# Patient Record
Sex: Male | Born: 1955 | Race: White | Hispanic: No
Health system: Southern US, Community
[De-identification: ages and names within clinical notes are randomized; demographics above are authoritative.]

---

## 1978-05-29 ENCOUNTER — Encounter: Payer: Self-pay | Admitting: Internal Medicine

## 2002-06-01 ENCOUNTER — Ambulatory Visit (HOSPITAL_COMMUNITY): Admission: RE | Admit: 2002-06-01 | Discharge: 2002-06-02 | Payer: Self-pay | Admitting: Otolaryngology

## 2002-06-01 ENCOUNTER — Encounter (INDEPENDENT_AMBULATORY_CARE_PROVIDER_SITE_OTHER): Payer: Self-pay | Admitting: Specialist

## 2002-06-13 ENCOUNTER — Encounter: Payer: Self-pay | Admitting: Otolaryngology

## 2002-06-15 ENCOUNTER — Inpatient Hospital Stay (HOSPITAL_COMMUNITY): Admission: RE | Admit: 2002-06-15 | Discharge: 2002-06-20 | Payer: Self-pay | Admitting: Otolaryngology

## 2002-06-15 ENCOUNTER — Encounter (INDEPENDENT_AMBULATORY_CARE_PROVIDER_SITE_OTHER): Payer: Self-pay | Admitting: *Deleted

## 2002-07-04 ENCOUNTER — Ambulatory Visit: Admission: RE | Admit: 2002-07-04 | Discharge: 2002-09-28 | Payer: Self-pay | Admitting: *Deleted

## 2002-07-08 ENCOUNTER — Encounter: Admission: RE | Admit: 2002-07-08 | Discharge: 2002-07-08 | Payer: Self-pay | Admitting: Dentistry

## 2002-08-03 ENCOUNTER — Encounter: Payer: Self-pay | Admitting: *Deleted

## 2002-08-03 ENCOUNTER — Ambulatory Visit (HOSPITAL_COMMUNITY): Admission: RE | Admit: 2002-08-03 | Discharge: 2002-08-03 | Payer: Self-pay | Admitting: *Deleted

## 2002-08-29 ENCOUNTER — Ambulatory Visit: Admission: RE | Admit: 2002-08-29 | Discharge: 2002-08-29 | Payer: Self-pay | Admitting: Radiation Oncology

## 2002-10-04 ENCOUNTER — Encounter (HOSPITAL_COMMUNITY): Admission: RE | Admit: 2002-10-04 | Discharge: 2002-10-04 | Payer: Self-pay | Admitting: *Deleted

## 2002-10-18 ENCOUNTER — Ambulatory Visit: Admission: RE | Admit: 2002-10-18 | Discharge: 2002-10-18 | Payer: Self-pay | Admitting: *Deleted

## 2003-01-18 ENCOUNTER — Ambulatory Visit (HOSPITAL_COMMUNITY): Admission: RE | Admit: 2003-01-18 | Discharge: 2003-01-18 | Payer: Self-pay | Admitting: *Deleted

## 2003-05-22 ENCOUNTER — Ambulatory Visit (HOSPITAL_COMMUNITY): Admission: RE | Admit: 2003-05-22 | Discharge: 2003-05-22 | Payer: Self-pay | Admitting: Oncology

## 2003-05-24 ENCOUNTER — Ambulatory Visit: Admission: RE | Admit: 2003-05-24 | Discharge: 2003-05-24 | Payer: Self-pay | Admitting: *Deleted

## 2003-08-24 ENCOUNTER — Ambulatory Visit (HOSPITAL_COMMUNITY): Admission: RE | Admit: 2003-08-24 | Discharge: 2003-08-24 | Payer: Self-pay | Admitting: Oncology

## 2004-01-31 ENCOUNTER — Ambulatory Visit (HOSPITAL_COMMUNITY): Admission: RE | Admit: 2004-01-31 | Discharge: 2004-01-31 | Payer: Self-pay | Admitting: Oncology

## 2004-02-08 ENCOUNTER — Encounter: Payer: Self-pay | Admitting: Internal Medicine

## 2004-03-21 ENCOUNTER — Ambulatory Visit: Payer: Self-pay | Admitting: Internal Medicine

## 2004-05-02 ENCOUNTER — Ambulatory Visit: Payer: Self-pay | Admitting: Gastroenterology

## 2004-06-05 ENCOUNTER — Encounter: Payer: Self-pay | Admitting: Internal Medicine

## 2004-06-05 ENCOUNTER — Ambulatory Visit: Payer: Self-pay | Admitting: Gastroenterology

## 2004-06-11 ENCOUNTER — Ambulatory Visit: Payer: Self-pay | Admitting: Internal Medicine

## 2004-07-23 ENCOUNTER — Ambulatory Visit: Payer: Self-pay | Admitting: Oncology

## 2004-07-25 ENCOUNTER — Ambulatory Visit: Payer: Self-pay | Admitting: Internal Medicine

## 2004-07-25 ENCOUNTER — Ambulatory Visit (HOSPITAL_COMMUNITY): Admission: RE | Admit: 2004-07-25 | Discharge: 2004-07-25 | Payer: Self-pay | Admitting: Oncology

## 2004-08-12 ENCOUNTER — Ambulatory Visit: Payer: Self-pay | Admitting: Internal Medicine

## 2004-10-17 ENCOUNTER — Ambulatory Visit (HOSPITAL_COMMUNITY): Admission: RE | Admit: 2004-10-17 | Discharge: 2004-10-17 | Payer: Self-pay | Admitting: Oncology

## 2005-02-04 ENCOUNTER — Ambulatory Visit (HOSPITAL_COMMUNITY): Admission: RE | Admit: 2005-02-04 | Discharge: 2005-02-04 | Payer: Self-pay | Admitting: Oncology

## 2005-02-11 ENCOUNTER — Ambulatory Visit: Payer: Self-pay | Admitting: Oncology

## 2005-02-24 ENCOUNTER — Ambulatory Visit: Payer: Self-pay | Admitting: Internal Medicine

## 2005-04-03 ENCOUNTER — Ambulatory Visit: Payer: Self-pay | Admitting: Internal Medicine

## 2005-07-04 ENCOUNTER — Ambulatory Visit (HOSPITAL_COMMUNITY): Admission: RE | Admit: 2005-07-04 | Discharge: 2005-07-04 | Payer: Self-pay | Admitting: Oncology

## 2005-07-21 ENCOUNTER — Ambulatory Visit: Payer: Self-pay | Admitting: Oncology

## 2005-07-22 LAB — COMPREHENSIVE METABOLIC PANEL
ALT: 17 U/L (ref 0–40)
AST: 20 U/L (ref 0–37)
Albumin: 4.3 g/dL (ref 3.5–5.2)
Alkaline Phosphatase: 55 U/L (ref 39–117)
BUN: 8 mg/dL (ref 6–23)
CO2: 24 mEq/L (ref 19–32)
Calcium: 9.2 mg/dL (ref 8.4–10.5)
Chloride: 106 mEq/L (ref 96–112)
Creatinine, Ser: 1 mg/dL (ref 0.4–1.5)
Glucose, Bld: 98 mg/dL (ref 70–99)
Potassium: 3.8 mEq/L (ref 3.5–5.3)
Sodium: 141 mEq/L (ref 135–145)
Total Bilirubin: 0.6 mg/dL (ref 0.3–1.2)
Total Protein: 7.3 g/dL (ref 6.0–8.3)

## 2005-07-22 LAB — CBC WITH DIFFERENTIAL/PLATELET
BASO%: 0.7 % (ref 0.0–2.0)
EOS%: 2 % (ref 0.0–7.0)
HCT: 40.3 % (ref 38.7–49.9)
LYMPH%: 18.3 % (ref 14.0–48.0)
MCH: 31 pg (ref 28.0–33.4)
MCHC: 34.6 g/dL (ref 32.0–35.9)
MCV: 89.4 fL (ref 81.6–98.0)
MONO%: 11.3 % (ref 0.0–13.0)
NEUT%: 67.7 % (ref 40.0–75.0)
Platelets: 274 10*3/uL (ref 145–400)
RBC: 4.51 10*6/uL (ref 4.20–5.71)

## 2005-10-14 ENCOUNTER — Ambulatory Visit: Payer: Self-pay | Admitting: Internal Medicine

## 2005-10-14 LAB — CONVERTED CEMR LAB
RBC count: 4.71 10*6/uL
WBC, blood: 5.6 10*3/uL

## 2005-11-17 ENCOUNTER — Ambulatory Visit (HOSPITAL_COMMUNITY): Admission: RE | Admit: 2005-11-17 | Discharge: 2005-11-17 | Payer: Self-pay | Admitting: Oncology

## 2006-01-28 ENCOUNTER — Ambulatory Visit: Payer: Self-pay | Admitting: Oncology

## 2006-01-30 LAB — CBC WITH DIFFERENTIAL/PLATELET
BASO%: 0.9 % (ref 0.0–2.0)
EOS%: 1.6 % (ref 0.0–7.0)
HCT: 43.1 % (ref 38.7–49.9)
LYMPH%: 14.8 % (ref 14.0–48.0)
MCH: 31.2 pg (ref 28.0–33.4)
MCHC: 34.6 g/dL (ref 32.0–35.9)
MONO#: 0.8 10*3/uL (ref 0.1–0.9)
NEUT%: 73.9 % (ref 40.0–75.0)
Platelets: 262 10*3/uL (ref 145–400)
RBC: 4.79 10*6/uL (ref 4.20–5.71)
WBC: 9.2 10*3/uL (ref 4.0–10.0)
lymph#: 1.4 10*3/uL (ref 0.9–3.3)

## 2006-01-30 LAB — COMPREHENSIVE METABOLIC PANEL
ALT: 18 U/L (ref 0–40)
AST: 19 U/L (ref 0–37)
CO2: 24 mEq/L (ref 19–32)
Creatinine, Ser: 1.08 mg/dL (ref 0.40–1.50)
Sodium: 139 mEq/L (ref 135–145)
Total Bilirubin: 0.5 mg/dL (ref 0.3–1.2)
Total Protein: 7.4 g/dL (ref 6.0–8.3)

## 2006-02-17 ENCOUNTER — Ambulatory Visit: Payer: Self-pay | Admitting: Internal Medicine

## 2006-07-01 ENCOUNTER — Ambulatory Visit (HOSPITAL_COMMUNITY): Admission: RE | Admit: 2006-07-01 | Discharge: 2006-07-01 | Payer: Self-pay | Admitting: Oncology

## 2006-07-07 ENCOUNTER — Ambulatory Visit: Payer: Self-pay | Admitting: Oncology

## 2006-07-09 LAB — CBC WITH DIFFERENTIAL/PLATELET
BASO%: 0.9 % (ref 0.0–2.0)
EOS%: 2.4 % (ref 0.0–7.0)
MCH: 31.3 pg (ref 28.0–33.4)
MCHC: 35.4 g/dL (ref 32.0–35.9)
MONO#: 0.7 10*3/uL (ref 0.1–0.9)
RBC: 4.94 10*6/uL (ref 4.20–5.71)
RDW: 13.6 % (ref 11.2–14.6)
WBC: 7 10*3/uL (ref 4.0–10.0)
lymph#: 1.5 10*3/uL (ref 0.9–3.3)

## 2006-07-09 LAB — COMPREHENSIVE METABOLIC PANEL
ALT: 18 U/L (ref 0–53)
AST: 19 U/L (ref 0–37)
Albumin: 4.4 g/dL (ref 3.5–5.2)
Calcium: 9.9 mg/dL (ref 8.4–10.5)
Chloride: 103 mEq/L (ref 96–112)
Creatinine, Ser: 1.03 mg/dL (ref 0.40–1.50)
Potassium: 4.7 mEq/L (ref 3.5–5.3)
Sodium: 139 mEq/L (ref 135–145)
Total Protein: 7.6 g/dL (ref 6.0–8.3)

## 2006-08-12 ENCOUNTER — Ambulatory Visit: Payer: Self-pay | Admitting: Internal Medicine

## 2006-08-12 LAB — CONVERTED CEMR LAB
ALT: 18 units/L (ref 0–40)
Albumin: 4 g/dL (ref 3.5–5.2)
BUN: 6 mg/dL (ref 6–23)
Basophils Absolute: 0.1 10*3/uL (ref 0.0–0.1)
Basophils Relative: 1.5 % — ABNORMAL HIGH (ref 0.0–1.0)
Bilirubin, Direct: 0.1 mg/dL (ref 0.0–0.3)
CO2: 32 meq/L (ref 19–32)
Eosinophils Absolute: 0.2 10*3/uL (ref 0.0–0.6)
Glucose, Bld: 113 mg/dL — ABNORMAL HIGH (ref 70–99)
MCHC: 33.8 g/dL (ref 30.0–36.0)
Monocytes Absolute: 0.7 10*3/uL (ref 0.2–0.7)
Monocytes Relative: 10.7 % (ref 3.0–11.0)
Neutro Abs: 4 10*3/uL (ref 1.4–7.7)
Neutrophils Relative %: 61.2 % (ref 43.0–77.0)
RBC: 4.92 M/uL (ref 4.22–5.81)
TSH: 8.99 microintl units/mL — ABNORMAL HIGH (ref 0.35–5.50)
Total Bilirubin: 0.8 mg/dL (ref 0.3–1.2)
Total Protein: 7.3 g/dL (ref 6.0–8.3)
VLDL: 15 mg/dL (ref 0–40)

## 2006-08-19 ENCOUNTER — Encounter: Payer: Self-pay | Admitting: Internal Medicine

## 2006-08-19 DIAGNOSIS — Z860100 Personal history of colon polyps, unspecified: Secondary | ICD-10-CM | POA: Insufficient documentation

## 2006-08-19 DIAGNOSIS — Z86718 Personal history of other venous thrombosis and embolism: Secondary | ICD-10-CM

## 2006-08-19 DIAGNOSIS — C760 Malignant neoplasm of head, face and neck: Secondary | ICD-10-CM

## 2006-08-20 ENCOUNTER — Ambulatory Visit: Payer: Self-pay | Admitting: Internal Medicine

## 2006-11-26 DIAGNOSIS — M79609 Pain in unspecified limb: Secondary | ICD-10-CM

## 2006-11-27 ENCOUNTER — Ambulatory Visit: Payer: Self-pay | Admitting: Family Medicine

## 2006-11-27 ENCOUNTER — Ambulatory Visit: Payer: Self-pay

## 2006-11-27 ENCOUNTER — Encounter: Payer: Self-pay | Admitting: Internal Medicine

## 2007-01-15 ENCOUNTER — Ambulatory Visit: Payer: Self-pay | Admitting: Oncology

## 2007-01-19 LAB — COMPREHENSIVE METABOLIC PANEL
ALT: 20 U/L (ref 0–53)
AST: 19 U/L (ref 0–37)
Calcium: 9.4 mg/dL (ref 8.4–10.5)
Chloride: 104 mEq/L (ref 96–112)
Creatinine, Ser: 0.81 mg/dL (ref 0.40–1.50)
Total Bilirubin: 0.2 mg/dL — ABNORMAL LOW (ref 0.3–1.2)

## 2007-01-19 LAB — CBC WITH DIFFERENTIAL/PLATELET
BASO%: 0.6 % (ref 0.0–2.0)
EOS%: 2 % (ref 0.0–7.0)
HCT: 40.3 % (ref 38.7–49.9)
MCH: 31.6 pg (ref 28.0–33.4)
MCHC: 35.7 g/dL (ref 32.0–35.9)
NEUT%: 65.3 % (ref 40.0–75.0)
RBC: 4.55 10*6/uL (ref 4.20–5.71)
lymph#: 1.7 10*3/uL (ref 0.9–3.3)

## 2007-02-04 ENCOUNTER — Ambulatory Visit: Payer: Self-pay | Admitting: Internal Medicine

## 2007-07-19 ENCOUNTER — Encounter: Payer: Self-pay | Admitting: Internal Medicine

## 2007-07-20 ENCOUNTER — Ambulatory Visit: Payer: Self-pay | Admitting: Oncology

## 2007-07-21 ENCOUNTER — Telehealth: Payer: Self-pay | Admitting: Internal Medicine

## 2007-07-22 LAB — CBC WITH DIFFERENTIAL/PLATELET
BASO%: 0.5 % (ref 0.0–2.0)
EOS%: 2.3 % (ref 0.0–7.0)
HCT: 44.4 % (ref 38.7–49.9)
MCH: 30.5 pg (ref 28.0–33.4)
MCHC: 34.3 g/dL (ref 32.0–35.9)
MONO#: 1.1 10*3/uL — ABNORMAL HIGH (ref 0.1–0.9)
RBC: 4.99 10*6/uL (ref 4.20–5.71)
RDW: 14.6 % (ref 11.2–14.6)
WBC: 10.6 10*3/uL — ABNORMAL HIGH (ref 4.0–10.0)
lymph#: 1.6 10*3/uL (ref 0.9–3.3)

## 2007-07-22 LAB — COMPREHENSIVE METABOLIC PANEL
ALT: 18 U/L (ref 0–53)
AST: 18 U/L (ref 0–37)
CO2: 28 mEq/L (ref 19–32)
Calcium: 9.8 mg/dL (ref 8.4–10.5)
Chloride: 101 mEq/L (ref 96–112)
Potassium: 4.4 mEq/L (ref 3.5–5.3)
Sodium: 140 mEq/L (ref 135–145)
Total Protein: 7.7 g/dL (ref 6.0–8.3)

## 2007-07-27 ENCOUNTER — Telehealth: Payer: Self-pay | Admitting: Internal Medicine

## 2007-08-04 ENCOUNTER — Telehealth: Payer: Self-pay | Admitting: Internal Medicine

## 2007-08-09 ENCOUNTER — Ambulatory Visit: Payer: Self-pay | Admitting: Internal Medicine

## 2007-08-09 LAB — CONVERTED CEMR LAB
Albumin: 3.5 g/dL (ref 3.5–5.2)
Alkaline Phosphatase: 60 units/L (ref 39–117)
BUN: 10 mg/dL (ref 6–23)
Calcium: 9.2 mg/dL (ref 8.4–10.5)
Creatinine, Ser: 0.8 mg/dL (ref 0.4–1.5)
Eosinophils Absolute: 0.3 10*3/uL (ref 0.0–0.7)
Eosinophils Relative: 2.4 % (ref 0.0–5.0)
GFR calc Af Amer: 130 mL/min
GFR calc non Af Amer: 107 mL/min
Glucose, Bld: 105 mg/dL — ABNORMAL HIGH (ref 70–99)
Glucose, Urine, Semiquant: NEGATIVE
HCT: 45 % (ref 39.0–52.0)
HDL: 55.2 mg/dL (ref >39.0)
Hemoglobin: 15 g/dL (ref 13.0–17.0)
Ketones, urine, test strip: NEGATIVE
MCV: 92 fL (ref 78.0–100.0)
Monocytes Absolute: 0.7 10*3/uL (ref 0.1–1.0)
Monocytes Relative: 5.8 % (ref 3.0–12.0)
Neutro Abs: 8.9 10*3/uL — ABNORMAL HIGH (ref 1.4–7.7)
Nitrite: NEGATIVE
PSA: 0.74 ng/mL (ref 0.10–4.00)
Specific Gravity, Urine: 1.015
TSH: 15.86 microintl units/mL — ABNORMAL HIGH (ref 0.35–5.50)
Total CHOL/HDL Ratio: 3.3
Total Protein: 7.3 g/dL (ref 6.0–8.3)
Triglycerides: 60 mg/dL (ref 0–149)
WBC Urine, dipstick: NEGATIVE
WBC: 11.7 10*3/uL — ABNORMAL HIGH (ref 4.5–10.5)
pH: 7.5

## 2007-08-17 ENCOUNTER — Ambulatory Visit: Payer: Self-pay | Admitting: Internal Medicine

## 2007-08-17 DIAGNOSIS — C44300 Unspecified malignant neoplasm of skin of unspecified part of face: Secondary | ICD-10-CM | POA: Insufficient documentation

## 2007-08-17 DIAGNOSIS — IMO0002 Reserved for concepts with insufficient information to code with codable children: Secondary | ICD-10-CM

## 2007-08-17 DIAGNOSIS — E039 Hypothyroidism, unspecified: Secondary | ICD-10-CM | POA: Insufficient documentation

## 2007-08-17 DIAGNOSIS — C44309 Unspecified malignant neoplasm of skin of other parts of face: Secondary | ICD-10-CM | POA: Insufficient documentation

## 2007-08-25 ENCOUNTER — Telehealth (INDEPENDENT_AMBULATORY_CARE_PROVIDER_SITE_OTHER): Payer: Self-pay | Admitting: *Deleted

## 2007-09-20 ENCOUNTER — Ambulatory Visit: Payer: Self-pay | Admitting: Internal Medicine

## 2007-10-18 ENCOUNTER — Encounter: Payer: Self-pay | Admitting: Internal Medicine

## 2007-11-18 ENCOUNTER — Telehealth: Payer: Self-pay | Admitting: Internal Medicine

## 2007-12-09 ENCOUNTER — Ambulatory Visit: Payer: Self-pay | Admitting: Internal Medicine

## 2008-01-06 ENCOUNTER — Ambulatory Visit: Payer: Self-pay | Admitting: Internal Medicine

## 2008-01-06 DIAGNOSIS — J180 Bronchopneumonia, unspecified organism: Secondary | ICD-10-CM | POA: Insufficient documentation

## 2008-01-11 ENCOUNTER — Ambulatory Visit: Payer: Self-pay | Admitting: Internal Medicine

## 2008-01-17 ENCOUNTER — Ambulatory Visit: Payer: Self-pay | Admitting: Oncology

## 2008-01-19 LAB — COMPREHENSIVE METABOLIC PANEL
Albumin: 3.6 g/dL (ref 3.5–5.2)
Alkaline Phosphatase: 55 U/L (ref 39–117)
CO2: 29 mEq/L (ref 19–32)
Chloride: 102 mEq/L (ref 96–112)
Glucose, Bld: 97 mg/dL (ref 70–99)
Potassium: 4.1 mEq/L (ref 3.5–5.3)
Sodium: 137 mEq/L (ref 135–145)
Total Protein: 6.7 g/dL (ref 6.0–8.3)

## 2008-01-19 LAB — CBC WITH DIFFERENTIAL/PLATELET
Eosinophils Absolute: 0.2 10*3/uL (ref 0.0–0.5)
MONO#: 0.6 10*3/uL (ref 0.1–0.9)
MONO%: 8.9 % (ref 0.0–13.0)
NEUT#: 5 10*3/uL (ref 1.5–6.5)
RBC: 4.15 10*6/uL — ABNORMAL LOW (ref 4.20–5.71)
RDW: 14.7 % — ABNORMAL HIGH (ref 11.2–14.6)
WBC: 7.1 10*3/uL (ref 4.0–10.0)
lymph#: 1.2 10*3/uL (ref 0.9–3.3)

## 2008-02-17 ENCOUNTER — Ambulatory Visit: Payer: Self-pay | Admitting: Internal Medicine

## 2008-02-23 ENCOUNTER — Telehealth: Payer: Self-pay | Admitting: Internal Medicine

## 2008-02-24 ENCOUNTER — Encounter: Payer: Self-pay | Admitting: Internal Medicine

## 2008-02-25 ENCOUNTER — Ambulatory Visit: Payer: Self-pay | Admitting: Internal Medicine

## 2008-06-16 ENCOUNTER — Ambulatory Visit: Payer: Self-pay | Admitting: Internal Medicine

## 2008-06-16 DIAGNOSIS — J069 Acute upper respiratory infection, unspecified: Secondary | ICD-10-CM | POA: Insufficient documentation

## 2008-07-05 ENCOUNTER — Ambulatory Visit: Payer: Self-pay | Admitting: Internal Medicine

## 2008-07-05 DIAGNOSIS — K920 Hematemesis: Secondary | ICD-10-CM | POA: Insufficient documentation

## 2008-07-18 ENCOUNTER — Ambulatory Visit: Payer: Self-pay | Admitting: Internal Medicine

## 2008-07-18 LAB — CONVERTED CEMR LAB
AST: 17 units/L (ref 0–37)
Albumin: 3.8 g/dL (ref 3.5–5.2)
Alkaline Phosphatase: 58 units/L (ref 39–117)
Basophils Relative: 1.4 % (ref 0.0–3.0)
Chloride: 104 meq/L (ref 96–112)
Eosinophils Absolute: 0.9 10*3/uL — ABNORMAL HIGH (ref 0.0–0.7)
GFR calc non Af Amer: 106.84 mL/min (ref >60)
Glucose, Bld: 111 mg/dL — ABNORMAL HIGH (ref 70–99)
Glucose, Urine, Semiquant: NEGATIVE
Hemoglobin: 14.4 g/dL (ref 13.0–17.0)
MCHC: 35.3 g/dL (ref 30.0–36.0)
MCV: 88.5 fL (ref 78.0–100.0)
Monocytes Absolute: 1.2 10*3/uL — ABNORMAL HIGH (ref 0.1–1.0)
Neutro Abs: 11.9 10*3/uL — ABNORMAL HIGH (ref 1.4–7.7)
Potassium: 4.4 meq/L (ref 3.5–5.1)
Protein, U semiquant: NEGATIVE
RBC: 4.62 M/uL (ref 4.22–5.81)
Sodium: 141 meq/L (ref 135–145)
Total Bilirubin: 0.8 mg/dL (ref 0.3–1.2)
VLDL: 10.6 mg/dL (ref 0.0–40.0)
WBC Urine, dipstick: NEGATIVE
pH: 7

## 2008-07-26 ENCOUNTER — Ambulatory Visit: Payer: Self-pay | Admitting: Internal Medicine

## 2008-08-25 ENCOUNTER — Ambulatory Visit: Payer: Self-pay | Admitting: Internal Medicine

## 2008-09-14 ENCOUNTER — Ambulatory Visit: Payer: Self-pay | Admitting: Internal Medicine

## 2008-09-14 DIAGNOSIS — M549 Dorsalgia, unspecified: Secondary | ICD-10-CM | POA: Insufficient documentation

## 2008-10-02 ENCOUNTER — Ambulatory Visit: Payer: Self-pay | Admitting: Internal Medicine

## 2008-10-26 ENCOUNTER — Ambulatory Visit: Payer: Self-pay | Admitting: Internal Medicine

## 2008-11-22 ENCOUNTER — Ambulatory Visit: Payer: Self-pay | Admitting: Oncology

## 2008-11-24 LAB — CBC WITH DIFFERENTIAL/PLATELET
Basophils Absolute: 0 10*3/uL (ref 0.0–0.1)
Eosinophils Absolute: 0.4 10*3/uL (ref 0.0–0.5)
HCT: 35.9 % — ABNORMAL LOW (ref 38.4–49.9)
HGB: 12.4 g/dL — ABNORMAL LOW (ref 13.0–17.1)
LYMPH%: 17.6 % (ref 14.0–49.0)
MCV: 88.2 fL (ref 79.3–98.0)
MONO#: 0.8 10*3/uL (ref 0.1–0.9)
MONO%: 8.6 % (ref 0.0–14.0)
NEUT#: 6.2 10*3/uL (ref 1.5–6.5)
NEUT%: 69 % (ref 39.0–75.0)
Platelets: 238 10*3/uL (ref 140–400)
RBC: 4.07 10*6/uL — ABNORMAL LOW (ref 4.20–5.82)
WBC: 9 10*3/uL (ref 4.0–10.3)

## 2008-11-24 LAB — COMPREHENSIVE METABOLIC PANEL
Alkaline Phosphatase: 51 U/L (ref 39–117)
BUN: 38 mg/dL — ABNORMAL HIGH (ref 6–23)
CO2: 23 mEq/L (ref 19–32)
Glucose, Bld: 104 mg/dL — ABNORMAL HIGH (ref 70–99)
Sodium: 130 mEq/L — ABNORMAL LOW (ref 135–145)
Total Bilirubin: 0.4 mg/dL (ref 0.3–1.2)
Total Protein: 7.6 g/dL (ref 6.0–8.3)

## 2008-11-29 ENCOUNTER — Ambulatory Visit (HOSPITAL_COMMUNITY): Admission: RE | Admit: 2008-11-29 | Discharge: 2008-11-29 | Payer: Self-pay | Admitting: Oncology

## 2008-11-29 ENCOUNTER — Encounter: Payer: Self-pay | Admitting: Internal Medicine

## 2008-12-14 LAB — CBC WITH DIFFERENTIAL/PLATELET
Basophils Absolute: 0.1 10*3/uL (ref 0.0–0.1)
Eosinophils Absolute: 0.4 10*3/uL (ref 0.0–0.5)
HGB: 11.5 g/dL — ABNORMAL LOW (ref 13.0–17.1)
LYMPH%: 22.3 % (ref 14.0–49.0)
MONO#: 0.8 10*3/uL (ref 0.1–0.9)
NEUT#: 4.5 10*3/uL (ref 1.5–6.5)
Platelets: 311 10*3/uL (ref 140–400)
RBC: 3.79 10*6/uL — ABNORMAL LOW (ref 4.20–5.82)
RDW: 13.9 % (ref 11.0–14.6)
WBC: 7.5 10*3/uL (ref 4.0–10.3)

## 2008-12-14 LAB — COMPREHENSIVE METABOLIC PANEL
Albumin: 3.9 g/dL (ref 3.5–5.2)
BUN: 19 mg/dL (ref 6–23)
CO2: 23 mEq/L (ref 19–32)
Calcium: 9.1 mg/dL (ref 8.4–10.5)
Glucose, Bld: 112 mg/dL — ABNORMAL HIGH (ref 70–99)
Potassium: 4.8 mEq/L (ref 3.5–5.3)
Sodium: 133 mEq/L — ABNORMAL LOW (ref 135–145)
Total Protein: 7.4 g/dL (ref 6.0–8.3)

## 2008-12-19 ENCOUNTER — Ambulatory Visit: Payer: Self-pay | Admitting: Internal Medicine

## 2008-12-28 ENCOUNTER — Ambulatory Visit: Payer: Self-pay | Admitting: Internal Medicine

## 2008-12-28 DIAGNOSIS — S8000XA Contusion of unspecified knee, initial encounter: Secondary | ICD-10-CM

## 2009-01-08 ENCOUNTER — Encounter: Payer: Self-pay | Admitting: Internal Medicine

## 2009-01-18 ENCOUNTER — Encounter (INDEPENDENT_AMBULATORY_CARE_PROVIDER_SITE_OTHER): Payer: Self-pay | Admitting: *Deleted

## 2009-02-07 ENCOUNTER — Ambulatory Visit: Payer: Self-pay | Admitting: Internal Medicine

## 2009-02-08 ENCOUNTER — Ambulatory Visit: Payer: Self-pay | Admitting: Internal Medicine

## 2009-02-08 DIAGNOSIS — Z87891 Personal history of nicotine dependence: Secondary | ICD-10-CM

## 2009-02-15 ENCOUNTER — Ambulatory Visit: Payer: Self-pay | Admitting: Internal Medicine

## 2009-02-15 DIAGNOSIS — L255 Unspecified contact dermatitis due to plants, except food: Secondary | ICD-10-CM | POA: Insufficient documentation

## 2009-03-19 ENCOUNTER — Ambulatory Visit: Payer: Self-pay | Admitting: Internal Medicine

## 2009-03-27 ENCOUNTER — Ambulatory Visit: Payer: Self-pay | Admitting: Internal Medicine

## 2009-05-02 ENCOUNTER — Encounter (INDEPENDENT_AMBULATORY_CARE_PROVIDER_SITE_OTHER): Payer: Self-pay | Admitting: *Deleted

## 2009-05-07 ENCOUNTER — Ambulatory Visit: Payer: Self-pay | Admitting: Internal Medicine

## 2009-06-04 ENCOUNTER — Ambulatory Visit: Payer: Self-pay | Admitting: Internal Medicine

## 2009-06-11 ENCOUNTER — Ambulatory Visit: Payer: Self-pay | Admitting: Oncology

## 2009-06-13 ENCOUNTER — Encounter: Payer: Self-pay | Admitting: Internal Medicine

## 2009-06-13 LAB — CBC WITH DIFFERENTIAL/PLATELET
Basophils Absolute: 0.1 10*3/uL (ref 0.0–0.1)
EOS%: 4.7 % (ref 0.0–7.0)
Eosinophils Absolute: 0.5 10*3/uL (ref 0.0–0.5)
HCT: 35.1 % — ABNORMAL LOW (ref 38.4–49.9)
HGB: 11.8 g/dL — ABNORMAL LOW (ref 13.0–17.1)
MCH: 30.2 pg (ref 27.2–33.4)
MCV: 90.1 fL (ref 79.3–98.0)
MONO%: 8.7 % (ref 0.0–14.0)
NEUT#: 7.4 10*3/uL — ABNORMAL HIGH (ref 1.5–6.5)
NEUT%: 68.1 % (ref 39.0–75.0)
Platelets: 300 10*3/uL (ref 140–400)

## 2009-06-13 LAB — COMPREHENSIVE METABOLIC PANEL
AST: 19 U/L (ref 0–37)
Albumin: 3.5 g/dL (ref 3.5–5.2)
Alkaline Phosphatase: 56 U/L (ref 39–117)
BUN: 18 mg/dL (ref 6–23)
Calcium: 9.1 mg/dL (ref 8.4–10.5)
Creatinine, Ser: 0.95 mg/dL (ref 0.40–1.50)
Glucose, Bld: 90 mg/dL (ref 70–99)

## 2009-06-15 LAB — IRON AND TIBC
%SAT: 12 % — ABNORMAL LOW (ref 20–55)
Iron: 36 ug/dL — ABNORMAL LOW (ref 42–165)
TIBC: 290 ug/dL (ref 215–435)
UIBC: 254 ug/dL

## 2009-06-15 LAB — SPEP & IFE WITH QIG
Albumin ELP: 46.9 % — ABNORMAL LOW (ref 55.8–66.1)
Alpha-1-Globulin: 6 % — ABNORMAL HIGH (ref 2.9–4.9)
Alpha-2-Globulin: 11.3 % (ref 7.1–11.8)
Beta 2: 7.1 % — ABNORMAL HIGH (ref 3.2–6.5)
Beta Globulin: 6.1 % (ref 4.7–7.2)
Gamma Globulin: 22.6 % — ABNORMAL HIGH (ref 11.1–18.8)
IgA: 448 mg/dL — ABNORMAL HIGH (ref 68–378)
IgG (Immunoglobin G), Serum: 1690 mg/dL — ABNORMAL HIGH (ref 694–1618)
IgM, Serum: 54 mg/dL — ABNORMAL LOW (ref 60–263)
Total Protein, Serum Electrophoresis: 7.9 g/dL (ref 6.0–8.3)

## 2009-06-15 LAB — FERRITIN: Ferritin: 151 ng/mL (ref 22–322)

## 2009-06-15 LAB — FOLATE: Folate: >20 ng/mL

## 2009-06-15 LAB — ERYTHROPOIETIN: Erythropoietin: 27.5 m[IU]/mL (ref 2.6–34.0)

## 2009-06-15 LAB — VITAMIN B12: Vitamin B-12: 1054 pg/mL — ABNORMAL HIGH (ref 211–911)

## 2009-06-28 ENCOUNTER — Encounter (INDEPENDENT_AMBULATORY_CARE_PROVIDER_SITE_OTHER): Payer: Self-pay | Admitting: *Deleted

## 2009-06-29 ENCOUNTER — Ambulatory Visit: Payer: Self-pay | Admitting: Gastroenterology

## 2009-07-13 ENCOUNTER — Ambulatory Visit: Payer: Self-pay | Admitting: Gastroenterology

## 2009-08-16 ENCOUNTER — Ambulatory Visit: Payer: Self-pay | Admitting: Internal Medicine

## 2009-08-16 LAB — CONVERTED CEMR LAB
Albumin: 3.5 g/dL (ref 3.5–5.2)
BUN: 10 mg/dL (ref 6–23)
Basophils Absolute: 0.2 10*3/uL — ABNORMAL HIGH (ref 0.0–0.1)
Bilirubin Urine: NEGATIVE
CO2: 30 meq/L (ref 19–32)
Calcium: 9.1 mg/dL (ref 8.4–10.5)
Cholesterol: 175 mg/dL (ref 0–200)
Eosinophils Absolute: 0.9 10*3/uL — ABNORMAL HIGH (ref 0.0–0.7)
GFR calc non Af Amer: 92.89 mL/min (ref >60)
Glucose, Bld: 109 mg/dL — ABNORMAL HIGH (ref 70–99)
HCT: 40.8 % (ref 39.0–52.0)
HDL: 44.8 mg/dL (ref >39.00)
Hemoglobin: 13.6 g/dL (ref 13.0–17.0)
Ketones, urine, test strip: NEGATIVE
Lymphocytes Relative: 18.4 % (ref 12.0–46.0)
Lymphs Abs: 2.6 10*3/uL (ref 0.7–4.0)
MCHC: 33.4 g/dL (ref 30.0–36.0)
Neutro Abs: 9.5 10*3/uL — ABNORMAL HIGH (ref 1.4–7.7)
Platelets: 278 10*3/uL (ref 150.0–400.0)
Protein, U semiquant: NEGATIVE
RDW: 15.4 % — ABNORMAL HIGH (ref 11.5–14.6)
Sodium: 140 meq/L (ref 135–145)
Specific Gravity, Urine: 1.015
TSH: 3.21 microintl units/mL (ref 0.35–5.50)
Total Bilirubin: 0.4 mg/dL (ref 0.3–1.2)
Triglycerides: 62 mg/dL (ref 0.0–149.0)
VLDL: 12.4 mg/dL (ref 0.0–40.0)
WBC Urine, dipstick: NEGATIVE
pH: 7

## 2009-08-24 ENCOUNTER — Ambulatory Visit: Payer: Self-pay | Admitting: Internal Medicine

## 2009-09-18 ENCOUNTER — Telehealth: Payer: Self-pay

## 2009-09-20 ENCOUNTER — Ambulatory Visit: Payer: Self-pay | Admitting: Internal Medicine

## 2009-10-30 ENCOUNTER — Telehealth: Payer: Self-pay | Admitting: Internal Medicine

## 2009-11-01 ENCOUNTER — Encounter: Payer: Self-pay | Admitting: Internal Medicine

## 2009-11-01 ENCOUNTER — Ambulatory Visit (HOSPITAL_COMMUNITY): Admission: RE | Admit: 2009-11-01 | Discharge: 2009-11-01 | Payer: Self-pay | Admitting: Oncology

## 2009-11-08 ENCOUNTER — Ambulatory Visit: Payer: Self-pay | Admitting: Oncology

## 2009-11-12 ENCOUNTER — Encounter: Payer: Self-pay | Admitting: Internal Medicine

## 2009-11-12 LAB — CBC WITH DIFFERENTIAL/PLATELET
Basophils Absolute: 0.1 10*3/uL (ref 0.0–0.1)
Eosinophils Absolute: 0.4 10*3/uL (ref 0.0–0.5)
HCT: 41.2 % (ref 38.4–49.9)
HGB: 13.8 g/dL (ref 13.0–17.1)
MCH: 29.1 pg (ref 27.2–33.4)
MONO#: 1.2 10*3/uL — ABNORMAL HIGH (ref 0.1–0.9)
NEUT#: 8.1 10*3/uL — ABNORMAL HIGH (ref 1.5–6.5)
NEUT%: 71 % (ref 39.0–75.0)
lymph#: 1.6 10*3/uL (ref 0.9–3.3)

## 2009-11-19 ENCOUNTER — Ambulatory Visit: Payer: Self-pay | Admitting: Internal Medicine

## 2009-12-06 ENCOUNTER — Encounter: Payer: Self-pay | Admitting: Gastroenterology

## 2009-12-06 ENCOUNTER — Ambulatory Visit: Payer: Self-pay | Admitting: Internal Medicine

## 2009-12-06 ENCOUNTER — Telehealth: Payer: Self-pay

## 2009-12-06 DIAGNOSIS — R109 Unspecified abdominal pain: Secondary | ICD-10-CM | POA: Insufficient documentation

## 2009-12-06 DIAGNOSIS — R634 Abnormal weight loss: Secondary | ICD-10-CM

## 2010-02-04 ENCOUNTER — Ambulatory Visit: Payer: Self-pay | Admitting: Gastroenterology

## 2010-02-04 DIAGNOSIS — R112 Nausea with vomiting, unspecified: Secondary | ICD-10-CM | POA: Insufficient documentation

## 2010-02-05 LAB — CONVERTED CEMR LAB
ALT: 19 units/L (ref 0–53)
AST: 18 units/L (ref 0–37)
Albumin: 2.9 g/dL — ABNORMAL LOW (ref 3.5–5.2)
Alkaline Phosphatase: 63 units/L (ref 39–117)
Basophils Relative: 0.9 % (ref 0.0–3.0)
Chloride: 100 meq/L (ref 96–112)
Eosinophils Relative: 5.7 % — ABNORMAL HIGH (ref 0.0–5.0)
GFR calc non Af Amer: 79.36 mL/min (ref >60)
HCT: 34.3 % — ABNORMAL LOW (ref 39.0–52.0)
Hemoglobin: 11.5 g/dL — ABNORMAL LOW (ref 13.0–17.0)
Lymphs Abs: 2.2 10*3/uL (ref 0.7–4.0)
MCV: 84.4 fL (ref 78.0–100.0)
Monocytes Relative: 9 % (ref 3.0–12.0)
Neutro Abs: 7.4 10*3/uL (ref 1.4–7.7)
Potassium: 5 meq/L (ref 3.5–5.1)
RBC: 4.07 M/uL — ABNORMAL LOW (ref 4.22–5.81)
Sodium: 139 meq/L (ref 135–145)
TSH: 5.73 microintl units/mL — ABNORMAL HIGH (ref 0.35–5.50)
Total Protein: 7.8 g/dL (ref 6.0–8.3)
WBC: 11.4 10*3/uL — ABNORMAL HIGH (ref 4.5–10.5)

## 2010-02-06 ENCOUNTER — Ambulatory Visit: Payer: Self-pay | Admitting: Gastroenterology

## 2010-02-06 ENCOUNTER — Other Ambulatory Visit
Admission: RE | Admit: 2010-02-06 | Discharge: 2010-02-06 | Payer: Self-pay | Source: Home / Self Care | Admitting: Gastroenterology

## 2010-02-06 LAB — CONVERTED CEMR LAB: Folate: >20 ng/mL

## 2010-02-07 LAB — CONVERTED CEMR LAB
Iron: 30 ug/dL — ABNORMAL LOW (ref 42–165)
Saturation Ratios: 9 % — ABNORMAL LOW (ref 20.0–50.0)
Transferrin: 237.1 mg/dL (ref 212.0–360.0)

## 2010-02-10 ENCOUNTER — Encounter: Payer: Self-pay | Admitting: Gastroenterology

## 2010-02-14 ENCOUNTER — Telehealth: Payer: Self-pay | Admitting: Gastroenterology

## 2010-02-15 ENCOUNTER — Ambulatory Visit: Payer: Self-pay | Admitting: Internal Medicine

## 2010-02-18 ENCOUNTER — Ambulatory Visit: Payer: Self-pay | Admitting: Cardiology

## 2010-02-21 ENCOUNTER — Ambulatory Visit: Payer: Self-pay | Admitting: Internal Medicine

## 2010-02-22 ENCOUNTER — Encounter: Payer: Self-pay | Admitting: Internal Medicine

## 2010-02-22 ENCOUNTER — Ambulatory Visit: Payer: Self-pay | Admitting: Oncology

## 2010-02-22 LAB — CBC WITH DIFFERENTIAL/PLATELET
Eosinophils Absolute: 0.2 10*3/uL (ref 0.0–0.5)
HCT: 37.9 % — ABNORMAL LOW (ref 38.4–49.9)
LYMPH%: 10.1 % — ABNORMAL LOW (ref 14.0–49.0)
MONO#: 1 10*3/uL — ABNORMAL HIGH (ref 0.1–0.9)
NEUT#: 13.2 10*3/uL — ABNORMAL HIGH (ref 1.5–6.5)
NEUT%: 81.6 % — ABNORMAL HIGH (ref 39.0–75.0)
Platelets: 278 10*3/uL (ref 140–400)
WBC: 16.1 10*3/uL — ABNORMAL HIGH (ref 4.0–10.3)

## 2010-02-22 LAB — COMPREHENSIVE METABOLIC PANEL
CO2: 27 mEq/L (ref 19–32)
Calcium: 8.9 mg/dL (ref 8.4–10.5)
Chloride: 99 mEq/L (ref 96–112)
Creatinine, Ser: 0.92 mg/dL (ref 0.40–1.50)
Glucose, Bld: 142 mg/dL — ABNORMAL HIGH (ref 70–99)
Sodium: 136 mEq/L (ref 135–145)
Total Bilirubin: 0.3 mg/dL (ref 0.3–1.2)
Total Protein: 7.9 g/dL (ref 6.0–8.3)

## 2010-02-28 ENCOUNTER — Encounter: Payer: Self-pay | Admitting: Internal Medicine

## 2010-02-28 ENCOUNTER — Ambulatory Visit: Payer: Self-pay | Admitting: Cardiothoracic Surgery

## 2010-02-28 ENCOUNTER — Ambulatory Visit (HOSPITAL_COMMUNITY)
Admission: RE | Admit: 2010-02-28 | Discharge: 2010-02-28 | Payer: Self-pay | Source: Home / Self Care | Admitting: Oncology

## 2010-03-12 ENCOUNTER — Telehealth: Payer: Self-pay | Admitting: Internal Medicine

## 2010-03-18 ENCOUNTER — Ambulatory Visit: Payer: Self-pay | Admitting: Internal Medicine

## 2010-03-18 DIAGNOSIS — J8289 Other pulmonary eosinophilia, not elsewhere classified: Secondary | ICD-10-CM | POA: Insufficient documentation

## 2010-03-18 DIAGNOSIS — J82 Pulmonary eosinophilia, not elsewhere classified: Secondary | ICD-10-CM

## 2010-03-19 ENCOUNTER — Telehealth (INDEPENDENT_AMBULATORY_CARE_PROVIDER_SITE_OTHER): Payer: Self-pay | Admitting: *Deleted

## 2010-03-19 LAB — CONVERTED CEMR LAB
Eosinophils Absolute: 0.3 10*3/uL (ref 0.0–0.7)
Eosinophils Relative: 2.3 % (ref 0.0–5.0)
HIV: NONREACTIVE
MCHC: 33 g/dL (ref 30.0–36.0)
MCV: 86.1 fL (ref 78.0–100.0)
Monocytes Absolute: 1 10*3/uL (ref 0.1–1.0)
Neutrophils Relative %: 74.8 % (ref 43.0–77.0)
Platelets: 302 10*3/uL (ref 150.0–400.0)
Sed Rate: 91 mm/hr — ABNORMAL HIGH (ref 0–22)
WBC: 14 10*3/uL — ABNORMAL HIGH (ref 4.5–10.5)

## 2010-03-20 ENCOUNTER — Encounter: Payer: Self-pay | Admitting: Internal Medicine

## 2010-03-29 ENCOUNTER — Encounter: Payer: Self-pay | Admitting: Internal Medicine

## 2010-03-29 ENCOUNTER — Ambulatory Visit (HOSPITAL_COMMUNITY)
Admission: RE | Admit: 2010-03-29 | Discharge: 2010-03-29 | Payer: Self-pay | Source: Home / Self Care | Attending: Internal Medicine | Admitting: Internal Medicine

## 2010-04-02 ENCOUNTER — Encounter: Payer: Self-pay | Admitting: Internal Medicine

## 2010-04-03 ENCOUNTER — Ambulatory Visit: Payer: Self-pay | Admitting: Internal Medicine

## 2010-04-09 ENCOUNTER — Telehealth: Payer: Self-pay | Admitting: Internal Medicine

## 2010-04-11 ENCOUNTER — Ambulatory Visit: Payer: Self-pay | Admitting: Gastroenterology

## 2010-04-11 DIAGNOSIS — R1312 Dysphagia, oropharyngeal phase: Secondary | ICD-10-CM

## 2010-04-11 DIAGNOSIS — D509 Iron deficiency anemia, unspecified: Secondary | ICD-10-CM | POA: Insufficient documentation

## 2010-05-12 ENCOUNTER — Encounter: Payer: Self-pay | Admitting: Oncology

## 2010-05-13 ENCOUNTER — Emergency Department (HOSPITAL_COMMUNITY)
Admission: EM | Admit: 2010-05-13 | Discharge: 2010-05-22 | Disposition: E | Payer: Self-pay | Source: Home / Self Care | Admitting: Emergency Medicine

## 2010-05-13 ENCOUNTER — Encounter: Payer: Self-pay | Admitting: Internal Medicine

## 2010-05-13 ENCOUNTER — Ambulatory Visit: Payer: Self-pay | Admitting: Oncology

## 2010-05-15 ENCOUNTER — Telehealth: Payer: Self-pay | Admitting: Internal Medicine

## 2010-05-16 ENCOUNTER — Ambulatory Visit: Admit: 2010-05-16 | Payer: Self-pay | Admitting: Internal Medicine

## 2010-05-17 ENCOUNTER — Ambulatory Visit: Admit: 2010-05-17 | Payer: Self-pay | Admitting: Internal Medicine

## 2010-05-17 ENCOUNTER — Telehealth: Payer: Self-pay | Admitting: Internal Medicine

## 2010-05-21 NOTE — Assessment & Plan Note (Signed)
Summary: consult re: abdominal pains/cjr   Vital Signs:  Patient profile:   55 year old male Weight:      183 pounds Temp:     97.9 degrees F oral BP sitting:   124 / 82  (left arm) Cuff size:   regular  Vitals Entered By: Kathrynn Speed CMA (December 06, 2009 8:12 AM) CC: Consult abdominal pain for years, pain is worse, nausea monday, src   CC:  Consult abdominal pain for years, pain is worse, nausea monday, and src.  History of Present Illness: 53 -year-old patient who has a prior history of head and neck neoplasm.  He has had a fairly recent PET scan that has been negative.  For the past two years, he has had intermittent  left upper quadrant pain.  This is associated with episodes of nausea and vomiting that  last 12-24 hrs.  Over the past year.  He has lost approximately 25 pounds in weight.  He has a history of colonic polyps and did have a colonoscopy earlier this year.  Medical regimen includes diclofenac for knee pain.  He states that his appetite is well maintained.  Denies any significant heartburn symptoms or swallowing difficulty  Current Medications (verified): 1)  Valium 10 Mg Tabs (Diazepam) .... Take 1 Tablet By Mouth Every Six Hours 2)  Acyclovir 200 Mg  Caps (Acyclovir) .Marland Kitchen.. 1 Qid As Needed 3)  Viagra 100 Mg  Tabs (Sildenafil Citrate) .... As Directed 4)  Bufferin 325 Mg  Tabs (Aspirin Buf(Cacarb-Mgcarb-Mgo)) .Marland Kitchen.. 1 Once Daily 5)  Levothroid 100 Mcg  Tabs (Levothyroxine Sodium) .... One Daily 6)  Alavert Allergy/sinus 5-120 Mg  Tb12 (Loratadine-Pseudoephedrine) .... One Twice Daily As Needed 7)  Diclofenac Sodium 75 Mg Tbec (Diclofenac Sodium) .... One Twice Daily As Needed For Knee Pain 8)  Tramadol Hcl 50 Mg Tabs (Tramadol Hcl) .... Q8hrs Prn 9)  Mucinex 600 Mg Xr12h-Tab (Guaifenesin) .... Bid 10)  Bystolic 5 Mg Tabs (Nebivolol Hcl) .... One Daily 11)  Prochlorperazine Edisylate 5 Mg/ml Soln (Prochlorperazine Edisylate)  Allergies (verified): No Known Drug  Allergies  Past History:  Past Medical History: Reviewed history from 08/17/2007 and no changes required. 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 colonoscopy February 2006, 06-2009 negative  neck CT March 2008 status post left radical neck dissection and tonsillectomy status post radiation treatment with cis-platinum as a radiosensitizer left knee arthroscopic surgery February 1980 (, torn medial and lateral menisci)  Review of Systems       The patient complains of weight loss and abdominal pain.  The patient denies anorexia, fever, weight gain, vision loss, decreased hearing, hoarseness, chest pain, syncope, dyspnea on exertion, peripheral edema, prolonged cough, headaches, hemoptysis, melena, hematochezia, severe indigestion/heartburn, hematuria, incontinence, genital sores, muscle weakness, suspicious skin lesions, transient blindness, difficulty walking, depression, unusual weight change, abnormal bleeding, enlarged lymph nodes, angioedema, breast masses, and testicular masses.    Physical Exam  General:  underweight appearing.  140/84underweight appearing.   Head:  Normocephalic and atraumatic without obvious abnormalities. No apparent alopecia or balding. Mouth:  Oral mucosa and oropharynx without lesions or exudates.  Neck:  status post neck dissection Lungs:  scattered rhonchi bilaterally Heart:  Normal rate and regular rhythm. S1 and S2 normal without gallop, murmur, click, rub or other extra sounds. Abdomen:  Bowel sounds  positive,abdomen soft and non-tender without masses, organomegaly or hernias noted. Pulses:  R and L carotid,radial,femoral,dorsalis pedis and posterior tibial pulses are full and equal bilaterally Extremities:  No clubbing, cyanosis, edema,  or deformity noted with normal full range of motion of all joints.     Impression & Recommendations:  Problem # 1:  ABDOMINAL PAIN (ICD-789.00)  Will refer to GI for further evaluation and consideration of upper endoscopy; discontinue all anti-inflammatory medication  Orders: Gastroenterology Referral (GI)  Problem # 2:  WEIGHT LOSS, ABNORMAL (ICD-783.21)  Orders: Gastroenterology Referral (GI)  Problem # 3:  HYPERTENSION NEC (ICD-997.91)  Complete Medication List: 1)  Valium 10 Mg Tabs (Diazepam) .... Take 1 tablet by mouth every six hours 2)  Acyclovir 200 Mg Caps (Acyclovir) .Marland Kitchen.. 1 qid as needed 3)  Viagra 100 Mg Tabs (Sildenafil citrate) .... As directed 4)  Bufferin 325 Mg Tabs (Aspirin buf(cacarb-mgcarb-mgo)) .Marland Kitchen.. 1 once daily 5)  Levothroid 100 Mcg Tabs (Levothyroxine sodium) .... One daily 6)  Alavert Allergy/sinus 5-120 Mg Tb12 (Loratadine-pseudoephedrine) .... One twice daily as needed 7)  Diclofenac Sodium 75 Mg Tbec (Diclofenac sodium) .... One twice daily as needed for knee pain 8)  Tramadol Hcl 50 Mg Tabs (Tramadol hcl) .... Q8hrs prn 9)  Mucinex 600 Mg Xr12h-tab (Guaifenesin) .... Bid 10)  Bystolic 5 Mg Tabs (Nebivolol hcl) .... One daily 11)  Prochlorperazine Edisylate 5 Mg/ml Soln (Prochlorperazine edisylate)  Patient Instructions: 1)  discontinue diclofenac, and all anti-inflammatory medication 2)  GI follow-up as scheduled 3)  Check your Blood Pressure regularly. If it is above: 160/90 you should make an appointment.

## 2010-05-21 NOTE — Progress Notes (Signed)
Summary: pt returned call > lab results given  Phone Note Call from Patient   Caller: Patient Call For: wert Summary of Call: pt returned call from nurse. call [phone] Initial call taken by: Tivis Ringer, CNA,  March 19, 2010 9:12 AM  Follow-up for Phone Call        pt called again for lab results.  advised of lab results.  pt verbalized his understanding.  will keep his 12.14.11 appt w/ MW. Follow-up by: Boone Master CNA/MA,  March 19, 2010 9:38 AM

## 2010-05-21 NOTE — Assessment & Plan Note (Signed)
Summary: ABD PAIN & WEIGHT LOSS/YF   History of Present Illness Visit Type: follow up Primary GI MD: Elie Goody MD [facility] Primary Provider: Gordy Savers, MD  Requesting Provider: Gordy Savers, MD  Chief Complaint: Generalized abd pain, loss of appetite, weight loss, bloating, belching, nausea and vomiting, constipation and diarrhea  History of Present Illness:   This is a 55 year old male with a history of metastatic tonsillar squamous cell cancer initially diagnosed in 2004. He relates a 9 month history of intermittent nausea, vomiting, anorexia, and a 40 pound weight loss. He has also had alternating constipation, diarrhea. He had a few weeks of left-sided abdominal pain, which radiated to his flank and left back which resolved spontaneously. He underwent a PET/CT scan of his skull, neck, chest, and abdomen in July 2011, which showed some abnormal pulmonary findings that did not appear typical for metastatic disease. No gastrointestinal abnormalities were noted.  He underwent colonoscopy in March 2011 for followup of adenomatous colon polyps, revealing diverticulosis, a small colon polyp, and a fair bowel preparation. Blood work performed in April was unremarkable.  In addition, he notes intermittent difficulties with choking when swallowing foods, both solids and liquids for several years, and the symptoms appear to worsen over the past several months.   GI Review of Systems    Reports abdominal pain, belching, bloating, dysphagia with liquids, dysphagia with solids, loss of appetite, nausea, vomiting, and  weight loss.     Location of  Abdominal pain: generalized. Weight loss of 40 pounds over 9 months .   Denies acid reflux, chest pain, heartburn, vomiting blood, and  weight gain.      Reports change in bowel habits, constipation, diarrhea, and  hemorrhoids.     Denies anal fissure, black tarry stools, diverticulosis, fecal incontinence, heme positive stool, irritable  bowel syndrome, jaundice, light color stool, liver problems, rectal bleeding, and  rectal pain.   Current Medications (verified): 1)  Valium 10 Mg Tabs (Diazepam) .... Take 1 Tablet By Mouth Every Six Hours 2)  Acyclovir 200 Mg  Caps (Acyclovir) .Marland Kitchen.. 1 Qid As Needed 3)  Viagra 100 Mg  Tabs (Sildenafil Citrate) .... As Directed 4)  Bufferin 325 Mg  Tabs (Aspirin Buf(Cacarb-Mgcarb-Mgo)) .Marland Kitchen.. 1 Once Daily 5)  Levothroid 100 Mcg  Tabs (Levothyroxine Sodium) .... One Daily 6)  Alavert Allergy/sinus 5-120 Mg  Tb12 (Loratadine-Pseudoephedrine) .... One Twice Daily As Needed 7)  Tramadol Hcl 50 Mg Tabs (Tramadol Hcl) .... Q8hrs Prn 8)  Mucinex 600 Mg Xr12h-Tab (Guaifenesin) .... Bid 9)  Bystolic 5 Mg Tabs (Nebivolol Hcl) .... One Daily 10)  Prochlorperazine Maleate 10 Mg Tabs (Prochlorperazine Maleate) .... One By Mouth Every 6 Hours As Needed For Nausea 11)  Boost  Liqd (Nutritional Supplements) .... 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         The pertinent positives and negatives are noted as above and in the HPI. All other ROS were reviewed and were negative.  Vital Signs:  Patient profile:   55 year old male Height:      76.5 inches Weight:      173 pounds BMI:     20.86 BSA:     2.09 Pulse rate:   88 / minute Pulse rhythm:   regular BP sitting:   132 / 80  (left arm) Cuff size:   regular  Vitals Entered By: Ok Anis CMA (February 04, 2010 3:43 PM)  Physical Exam  General:  Thin and chronically ill-appearing, no acute distress. Head:  Normocephalic and atraumatic. Eyes:  PERRLA, no icterus. Ears:  Normal auditory acuity. Mouth:  No deformity or lesions, dentition normal. Neck:  Supple; no masses or thyromegaly. Chest Wall:  Symmetrical,  no deformities . Lungs:  Clear throughout to auscultation. Heart:  Regular rate and rhythm; no murmurs, rubs,  or bruits. Abdomen:  Soft, nontender and nondistended. No masses, hepatosplenomegaly or hernias noted. Normal bowel sounds. Msk:  Symmetrical with no gross deformities. Normal posture. Pulses:  Normal pulses noted. Extremities:  No clubbing, cyanosis, edema or deformities noted. Neurologic:  Alert and  oriented x4;  grossly normal neurologically. Cervical Nodes:  No significant cervical adenopathy. Inguinal Nodes:  No significant inguinal adenopathy. Psych:  Alert and cooperative. Normal mood and affect.  Impression & Recommendations:  Problem # 1:  NAUSEA WITH VOMITING (ICD-787.01) Recurrent nausea and vomiting associated with anorexia, weight loss, and solid and liquid dysphagia. In addition, he has an abnormal CT scan of the  chest, and a history of locally metastatic tonsillar cancer. Rule out ulcer disease and esophageal and/or gastric cancer. Rule out recurrent metastatic tonsillar cancer. Rule out celiac disease. He may need a modified barium esophagram pending findings at upper endoscopy. I suggested followup with his oncologist as well. Orders: EGD (EGD) TLB-BMP (Basic Metabolic Panel-BMET) (80048-METABOL) TLB-CBC Platelet - w/Differential (85025-CBCD) TLB-TSH (Thyroid Stimulating Hormone) (84443-TSH) TLB-Hepatic/Liver Function Pnl (80076-HEPATIC) TLB-Lipase (83690-LIPASE) TLB-IgA (Immunoglobulin A) (82784-IGA) T-Sprue Panel (Celiac Disease Aby Eval) (83516x3/[address])  Patient Instructions: 1)  Get your labs drawn today in the basement. 2)  Upper Endoscopy brochure given.  3)  Copy sent to : Gordy Savers, MD 4)  The medication list was reviewed and reconciled.  All changed / newly prescribed medications were explained.  A complete medication list was provided to the patient / caregiver.

## 2010-05-21 NOTE — Assessment & Plan Note (Signed)
Summary: 4 MNTH ROV//SLM/PT RESCD//CCM   Vital Signs:  Patient profile:   55 year old male Weight:      202 pounds Temp:     98.4 degrees F oral BP sitting:   118 / 70  (left arm) Cuff size:   regular  Vitals Entered By: Duard Brady LPN (June 04, 2009 4:32 PM) CC: ROV / f/u still with dizziness, occasional faint feeling, (r) hip pain, neck "pulsing"feeling   CC:  ROV / f/u still with dizziness, occasional faint feeling, (r) hip pain, and neck "pulsing"feeling.  History of Present Illness: 55 year old patient who has a history of multi-drug-resistant hypertension.  Bystolic was added as a fourth drug and titrated to a 20-mg dose.  He was seen one month ago, hypotensive, and mildly symptomatic and his diastolic dose was titrated from 20 mg to 10 mg daily.  He was told to hold his other antihypertensive medications until his blood pressure became greater than 130/90.  His blood pressure however, has remained normal on just  the Bystolic and at times has been slightly hypotensive.  He does describe some mild dizziness associated with his low blood pressure readings.  He describes much less stress in his personal life other complaints include some left sided neck pain and spasm and some mild right hip discomfort  Allergies: No Known Drug Allergies  Past History:  Past Medical History: Reviewed history from 08/17/2007 and no changes required. 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, February 2004 history of herpes genitalis  Physical Exam  General:  Well-developed,well-nourished,in no acute distress; alert,appropriate and cooperative throughout examination; blood pressure 110 to 120 systolic over 70 to 78, diastolic Neck:  status post radical neck dissection   Impression & Recommendations:  Problem # 1:  HYPERTENSION NEC (ICD-997.91) will decrease the patient's Bystolic to  25  mg daily and discontinue other blood pressure medication  Problem # 2:  BACK PAIN (ICD-724.5)  The following medications were removed from the medication list:    Propoxyphene N-apap 100-650 Mg Tabs (Propoxyphene n-apap) ..... One every 6 hours as needed for pain His updated medication list for this problem includes:    Bufferin 325 Mg Tabs (Aspirin buf(cacarb-mgcarb-mgo)) .Marland Kitchen... 1 once daily    Diclofenac Sodium 75 Mg Tbec (Diclofenac sodium) ..... One twice daily as needed for knee pain    Tramadol Hcl 50 Mg Tabs (Tramadol hcl) .Marland Kitchen... Prn  The following medications were removed from the medication list:    Propoxyphene N-apap 100-650 Mg Tabs (Propoxyphene n-apap) ..... One every 6 hours as needed for pain His updated medication list for this problem includes:    Bufferin 325 Mg Tabs (Aspirin buf(cacarb-mgcarb-mgo)) .Marland Kitchen... 1 once daily    Diclofenac Sodium 75 Mg Tbec (Diclofenac sodium) ..... One twice daily as needed for knee pain    Tramadol Hcl 50 Mg Tabs (Tramadol hcl) .Marland Kitchen... Prn  Complete Medication List: 1)  Valium 10 Mg Tabs (Diazepam) .... Take 1 tablet by mouth every six hours 2)  Acyclovir 200 Mg Caps (Acyclovir) .Marland Kitchen.. 1 qid as needed 3)  Viagra 100 Mg Tabs (Sildenafil citrate) .... As directed 4)  Bufferin 325 Mg Tabs (Aspirin buf(cacarb-mgcarb-mgo)) .Marland Kitchen.. 1 once daily 5)  Levothroid 100 Mcg Tabs (Levothyroxine sodium) .... One daily 6)  Alavert Allergy/sinus 5-120 Mg Tb12 (Loratadine-pseudoephedrine) .... One twice daily as needed 7)  Diclofenac Sodium 75 Mg Tbec (Diclofenac sodium) .... One twice daily as needed  for knee pain 8)  Tramadol Hcl 50 Mg Tabs (Tramadol hcl) .... Prn 9)  Mucinex 600 Mg Xr12h-tab (Guaifenesin) .... Bid 10)  Bystolic 5 Mg Tabs (Nebivolol hcl) .... One daily  Patient Instructions: 1)  Please schedule a follow-up appointment in 3 months. 2)  Limit your Sodium (Salt). 3)  It is important that you exercise regularly at least 20 minutes 5 times a week. If  you develop chest pain, have severe difficulty breathing, or feel very tired , stop exercising immediately and seek medical attention. 4)  Check your Blood Pressure regularly. If it is above: 150/90 you should make an appointment. Prescriptions: BYSTOLIC 5 MG TABS (NEBIVOLOL HCL) one daily  #90 x 3   Entered and Authorized by:   Gordy Savers  MD   Signed by:   Gordy Savers  MD on 06/04/2009   Method used:   Print then Give to Patient   RxID:   [id number]

## 2010-05-21 NOTE — Letter (Signed)
[facility]   Imported By: Maryln Gottron 06/29/2009 11:09:58  _____________________________________________________________________  External Attachment:    Type:   Image     Comment:   External Document

## 2010-05-21 NOTE — Assessment & Plan Note (Signed)
Summary: Pulmonary consultation/ pulmonary infiltrates ? etiology   Copy to:  Dr. Fritz Pickerel Primary Provider/Referring Provider:  Gordy Savers, MD   CC:  Pulmonary nodules.  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  W/u so far with EGD showing esophagitis > omeprazole helped the nausea.    Pt denies any significant   itching, sneezing,  nasal congestion or excess secretions,  fever, chills, sweats, pleuritic or exertional cp, hempoptysis, change in activity tolerance  orthopnea pnd or leg swelling Pt also denies any obvious fluctuation in symptoms with weather or environmental change or other alleviating or aggravating factors.       Current Medications (verified): 1)  Valium 10 Mg Tabs (Diazepam) .... Take 1 Tablet By Mouth Every Six Hours 2)  Acyclovir 200 Mg  Caps (Acyclovir) .Marland Kitchen.. 1 Four Times A Day As Needed 3)  Viagra 100 Mg  Tabs (Sildenafil Citrate) .... As Directed 4)  Bufferin 325 Mg  Tabs (Aspirin Buf(Cacarb-Mgcarb-Mgo)) .Marland Kitchen.. 1 Once Daily 5)  Levothroid 100 Mcg  Tabs (Levothyroxine Sodium) .... One Daily 6)  Tramadol Hcl 50 Mg Tabs (Tramadol Hcl) .Marland Kitchen.. 1 Every 8 Hrs As Needed 7)  Mucinex 600 Mg Xr12h-Tab (Guaifenesin) .Marland Kitchen.. 1 Two Times A Day 8)  Bystolic 5 Mg Tabs (Nebivolol Hcl) .... One Daily 9)  Prochlorperazine Maleate 10 Mg Tabs (Prochlorperazine Maleate) .... One By Mouth Every 6 Hours As Needed For Nausea 10)  Boost  Liqd (Nutritional Supplements) .... 2-3 Daily 11)  Ferrous Sulfate 325 (65 Fe) Mg Tabs  (Ferrous Sulfate) .... One Tablet By Mouth Two Times A Day With Meals 12)  Omeprazole 40 Mg Cpdr (Omeprazole) .... One Capsule By Mouth Every Morning 13)  Megestrol Acetate 40 Mg Tabs (Megestrol Acetate) .Marland Kitchen.. 1 Three Times A Day 14)  Probiotic  Caps (Probiotic Product) .Marland Kitchen.. 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 colonoscopy February 2006, 06-2009 negative  neck CT March 2008 status post left radical neck dissection and tonsillectomy status post radiation treatment with cis-platinum as a radiosensitizer left knee arthroscopic surgery February 1980 (, torn medial and lateral menisci)  Family History: Reviewed history from 02/04/2010 and no changes required. 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  Quit smoking cigs in  2001- smoked for 30 yrs up to 2 ppd. Alcohol Use - no Daily Caffeine Use: 3 daily   Review of Systems       The patient complains of shortness of breath with activity, productive cough, coughing up blood, acid heartburn, indigestion, loss of appetite, weight change, difficulty  swallowing, sore throat, headaches, anxiety, and change in color of mucus.  The patient denies shortness of breath at rest, non-productive cough, chest pain, irregular heartbeats, abdominal pain, tooth/dental problems, nasal congestion/difficulty breathing through nose, sneezing, itching, ear ache, depression, hand/feet swelling, joint stiffness or pain, rash, and fever.    Vital Signs:  Patient profile:   55 year old male Weight:      178 pounds O2 Sat:      91 % on Room air Temp:     98.1 degrees F oral Pulse rate:   80 / minute BP sitting:   124 / 80  (left arm)  Vitals Entered By: Vernie Murders (March 18, 2010 9:33 AM)  O2 Flow:  Room air  Physical Exam  Additional Exam:  wt 168 > 178 March 18 2010 Pleasant alert wm mod chronically ill appearing HEENT: nl dentition, turbinates, and orophanx. Nl external ear canals without cough reflex NECK :  s/p Left radial neck, no obvious nodes  LUNGS: no acc muscle use, clear to A and P bilaterally without cough on insp or exp maneuvers CV:  RRR  no s3 or murmur or increase in P2, no edema  ABD:  soft and nontender with nl excursion in the supine position. No bruits or organomegaly, bowel sounds nl MS:  warm without deformities, calf tenderness, cyanosis or clubbing SKIN: warm and dry without lesions   NEURO:  alert, approp, no deficits      White Cell Count     [H]  14.0 K/uL                   4.5-10.5   Red Cell Count            4.25 Mil/uL                 4.22-5.81   Hemoglobin           [L]  12.1 g/dL                   41.6-60.6   Hematocrit           [L]  36.6 %                      39.0-52.0   MCV                       86.1 fl                     78.0-100.0   MCHC                      33.0 g/dL                   30.1-60.1   RDW                  [H]  20.7 %                      11.5-14.6   Platelet Count            302.0  K/uL                  150.0-400.0   Neutrophil %              74.8 %                      43.0-77.0    Lymphocyte %              14.9 %                      12.0-46.0   Monocyte %                7.5 %                       3.0-12.0   Eosinophils%              2.3 %                       0.0-5.0   Basophils %               0.5 %                       0.0-3.0   Neutrophill Absolute [H]  10.4 K/uL                   1.4-7.7   Lymphocyte Absolute       2.1 K/uL                    0.7-4.0   Monocyte Absolute         1.0 K/uL                    0.1-1.0  Eosinophils, Absolute                             0.3 K/uL                    0.0-0.7   Basophils Absolute        0.1 K/uL                    0.0-0.1  Tests: (2) Sed Rate (ESR)   Sed Rate             [H]  91 mm/hr                    0-22  HIV Non-reactive  Impression & Recommendations:  Problem # 1:  PULMONARY INFILTRATE INCLUDES (EOSINOPHILIA) (ICD-518.3)  Most likely he is having chronic aspiration but not clear this is the cause of his infiltrates as they don't appear anatomically classic for  chronic aspiration.  Atypical TB is high on the ddx as is Boop which can cause nodular pattern but not discolored sputum.  First step is rx with levaquin and obtain a swallowing eval, then regroup.     Medications Added to Medication List This Visit: 1)  Acyclovir 200 Mg Caps (Acyclovir) .Marland Kitchen.. 1 four times a day as needed 2)  Tramadol Hcl 50 Mg Tabs (Tramadol hcl) .Marland Kitchen.. 1 every 8 hrs as needed 3)  Mucinex 600 Mg Xr12h-tab (Guaifenesin) .Marland Kitchen.. 1 two times a day 4)  Megestrol Acetate 40 Mg Tabs (Megestrol acetate) .Marland KitchenMarland KitchenMarland Kitchen 1  three times a day 5)  Probiotic Caps (Probiotic product) .Marland Kitchen.. 1 three times a day 6)  Levaquin 750 Mg Tabs (Levofloxacin) .... One tablet by mouth daily x 5 days  Other Orders: T-HIV Antibody  (Reflex) ([id number]) Misc. Referral (Misc. Ref) TLB-CBC Platelet - w/Differential (85025-CBCD) TLB-Sedimentation Rate (ESR) (85652-ESR) Consultation Level V (09811)  Patient Instructions: 1)  no aspirin until no blood at all for 3 days  then try 81 mg daily but stop this also if bleeding recurs 2)  Levaquin 750 x 5 days 3)  See Patient Care Coordinator before leaving for swallowing eval 4)  Please schedule a follow-up appointment in 1 weeks, sooner if needed with cxr on return Prescriptions: LEVAQUIN 750 MG  TABS (LEVOFLOXACIN) One tablet by mouth daily x 5 days  #5 x 0   Entered and Authorized by:   Nyoka Cowden MD   Signed by:   Nyoka Cowden MD on 03/18/2010   Method used:   Electronically to        [id number]       [address]       [place], [place]  [address]       Ph: [phone] or [phone]       Fax: [phone]   RxID:   [id number]

## 2010-05-21 NOTE — Procedures (Signed)
Summary: Upper Endoscopy  Patient: Juan Webb Note: All result statuses are Final unless otherwise noted.  Tests: (1) Upper Endoscopy (EGD)   EGD Upper Endoscopy       DONE     [facility]     [address]     [place], [place]  [address]           ENDOSCOPY PROCEDURE REPORT     PATIENT:  [name], [name]  MR#:  [id number]     BIRTHDATE:  1956/01/26, 54 yrs. old  GENDER:  male     ENDOSCOPIST:  Judie Petit T. Russella Dar, MD, [facility]           PROCEDURE DATE:  02/06/2010     PROCEDURE:  EGD with biopsy     ASA CLASS:  Class III     INDICATIONS:  weight loss, nausea and vomiting, anemia     MEDICATIONS:  Fentanyl 100 mcg IV, Versed 9 mg IV     TOPICAL ANESTHETIC:  Exactacain Spray     DESCRIPTION OF PROCEDURE:   After the risks benefits and     alternatives of the procedure were thoroughly explained, informed     consent was obtained.  The [facility] [id number] endoscope was     introduced through the mouth and advanced to the second portion of     the duodenum, without limitations.  The instrument was slowly     withdrawn as the mucosa was fully examined.     <<PROCEDUREIMAGES>>     Esophagitis was found in the distal esophagus with mild yellow     exudates. A brushing for candida was obtained.  Mild gastritis was     found in the antrum. It was erosive.  The duodenal bulb was normal     in appearance, as was the postbulbar duodenum. Random biopsies     were obtained and sent to pathology.  Otherwise the examination     was normal. Retroflexed views revealed no abnormalities. The scope     was then withdrawn from the patient and the procedure completed.           COMPLICATIONS:  None           ENDOSCOPIC IMPRESSION:     1) Exudative esophagitis     2) Mild gastritis           RECOMMENDATIONS:     1) Anti-reflux regimen     2) PPI qam: omeprazole 40mg  po qam, #30, 11 refills     3) diflucan 100mg  po qd, #7, no refills     4) OP follow-up in 3-4 weeks.     5) Await biopsies       Manessa Buley T. Russella Dar, MD, [facility]           CC:  Gordy Savers, MD           n.     [name]. Shamal Stracener at 02/06/2010 03:07 PM           [name], [id number]  Note: An exclamation mark (!) indicates a result that was not dispersed into the flowsheet. Document Creation Date: 02/06/2010 3:08 PM _______________________________________________________________________  (1) Order result status: Final Collection or observation date-time: 02/06/2010 15:02 Requested date-time:  Receipt date-time:  Reported date-time:  Referring Physician:   Ordering Physician: Claudette Head [phone]) Specimen Source:  Source: Launa Grill Order Number: [id number] Lab site:   Appended Document: Upper Endoscopy Appt scheduled for 03/12/10 at 9:30am and  pt verbalized understanding.

## 2010-05-21 NOTE — Letter (Signed)
Summary: Patient Notice-Endo Biopsy Results  [facility]  [address] [place], [place] [address]   Phone: [phone]  Fax: [phone]        February 10, 2010 MRN: [id number]    MURL GOLLADAY [name] [place], [place]  [address]    Dear Mr. KRUZEL,  I am pleased to inform you that the biopsies taken during your recent endoscopic examination did not show any evidence of cancer upon pathologic examination. The esophageal specimens showed candida esophagits. The duodenal biopsies were normal.  Continue with the treatment plan as outlined on the day of your      exam.  Please call us if you are having persistent problems or have questions about your condition that have not been fully answered at this time.  Sincerely,  Meryl Dare MD [facility]  This letter has been electronically signed by your physician.  Appended Document: Patient Notice-Endo Biopsy Results letter mailed

## 2010-05-21 NOTE — Progress Notes (Signed)
Summary: [facility]  Medications Added OMEPRAZOLE 40 MG CPDR (OMEPRAZOLE) one capsule by mouth every morning DIFLUCAN 100 MG TABS (FLUCONAZOLE) one tablet by mouth once daily       Phone Note Call from Patient   Call For: Dr Russella Dar Reason for Call: Talk to Nurse Summary of Call: Had procedure on 02-06-10 and was told he would get some medications. He was not gotten them yet. [facility] [address] Initial call taken by: Leanor Kail [facility],  February 14, 2010 1:12 PM  Follow-up for Phone Call        Left a message telling pt that I apologize for pt not recieving prescriptions at his pharmacy but I will send them in for him.  Follow-up by: Christie Nottingham CMA [facility]ull),  February 14, 2010 2:14 PM    New/Updated Medications: OMEPRAZOLE 40 MG CPDR (OMEPRAZOLE) one capsule by mouth every morning DIFLUCAN 100 MG TABS (FLUCONAZOLE) one tablet by mouth once daily Prescriptions: DIFLUCAN 100 MG TABS (FLUCONAZOLE) one tablet by mouth once daily  #7 x 0   Entered by:   Christie Nottingham CMA (AAMA)   Authorized by:   Meryl Dare MD [facility]   Signed by:   Christie Nottingham CMA (AAMA) on 02/14/2010   Method used:   [facility] to        [facility]       [address]       [place], [place]  [address]       Ph: [phone] or [phone]       Fax: [phone]   RxID:   [id number] OMEPRAZOLE 40 MG CPDR (OMEPRAZOLE) one capsule by mouth every morning  #30 x 11   Entered by:   Christie Nottingham CMA (AAMA)   Authorized by:   Meryl Dare MD [facility]   Signed by:   Christie Nottingham CMA (AAMA) on 02/14/2010   Method used:   [facility]ically to        [facility]       [address]       [place], [place]  [address]       Ph: [phone] or [phone]       Fax: [phone]   RxID:   [id number]

## 2010-05-21 NOTE — Progress Notes (Signed)
Summary: clarification on tramadol  Phone Note From Pharmacy   [facility] [address] [facility] needs clarification on frequency of tramadol Initial call taken by: Duard Brady LPN,  Sep 18, 2009 11:56 AM  Follow-up for Phone Call        sig changed and faxed to pharm KIK Follow-up by: Duard Brady LPN,  Sep 18, 2009 12:01 PM    New/Updated Medications: TRAMADOL HCL 50 MG TABS (TRAMADOL HCL) q8hrs prn Prescriptions: TRAMADOL HCL 50 MG TABS (TRAMADOL HCL) q8hrs prn  #50 x 6   Entered by:   Duard Brady LPN   Authorized by:   Gordy Savers  MD   Signed by:   Duard Brady LPN on 71/09/2692   Method used:   Faxed to ...       [id number]       [address]       [place], [place]  [address]       Ph: [phone] or [phone]       Fax: [phone]   RxID:   [id number]

## 2010-05-21 NOTE — Letter (Signed)
Summary: New Patient letter  [facility]  [address] [place], [place] [address]   Phone: [phone]  Fax: [phone]       12/06/2009 MRN: [id number]  Juan Webb [address] [place], [place]  [address]  Dear Juan Webb,  Welcome to the Gastroenterology Division at [facility].    You are scheduled to see Dr.  Russella Dar on 02-04-10 at 3:30pm on the 3rd floor at [facility], [address]  We ask that you try to arrive at our office 15 minutes prior to your appointment time to allow for check-in.  We would like you to complete the enclosed self-administered evaluation form prior to your visit and bring it with you on the day of your appointment.  We will review it with you.  Also, please bring a complete list of all your medications or, if you prefer, bring the medication bottles and we will list them.  Please bring your insurance card so that we may make a copy of it.  If your insurance requires a referral to see a specialist, please bring your referral form from your primary care physician.  Co-payments are due at the time of your visit and may be paid by cash, check or credit card.     Your office visit will consist of a consult with your physician (includes a physical exam), any laboratory testing he/she may order, scheduling of any necessary diagnostic testing (e.g. x-ray, ultrasound, CT-scan), and scheduling of a procedure (e.g. Endoscopy, Colonoscopy) if required.  Please allow enough time on your schedule to allow for any/all of these possibilities.    If you cannot keep your appointment, please call [phone] to cancel or reschedule prior to your appointment date.  This allows [place] the opportunity to schedule an appointment for another patient in need of care.  If you do not cancel or reschedule by 5 p.m. the business day prior to your appointment date, you will be charged a $50.00 late cancellation/no-show fee.    Thank you for choosing [facility] for your medical needs.  We appreciate the opportunity to care for you.  Please visit [place] at our website  to learn more about our practice.                     Sincerely,                                                             The Gastroenterology Division

## 2010-05-21 NOTE — Assessment & Plan Note (Signed)
Summary: chest congestion/hip pain/njr   Vital Signs:  Patient profile:   55 year old male Weight:      199 pounds BMI:     33.75 Temp:     97.6 degrees F oral BP sitting:   102 / 82  (left arm) Cuff size:   regular  Vitals Entered By: Raechel Ache, RN (May 07, 2009 3:54 PM) CC: C/o productive cough and r hip pain   CC:  C/o productive cough and r hip pain.  History of Present Illness: 73 -old patient who has a history of multi-drug-resistant hypertension, who presents with one-week history of URI symptoms.  He has a mild non-productive nonpurulent cough for the past week.  There is been a fever or chills.  Denies any chest pain or shortness of breath.  He states that he has had some increasing weakness.  Blood pressure medications have been titrated for the past year due to poorly controlled hypertension; this past fall.  His Btstolic  was increased to 20 mg daily  Allergies: No Known Drug Allergies  Past History:  Past Medical History: Reviewed history from 08/17/2007 and no changes required. 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  The patient denies fever, weight loss, weight gain, vision loss, decreased hearing, hoarseness, chest pain, syncope, dyspnea on exertion, peripheral edema, headaches, hemoptysis, abdominal pain, melena, hematochezia, severe indigestion/heartburn, hematuria, incontinence, genital sores, suspicious skin lesions, transient blindness, difficulty walking, depression, unusual weight change, abnormal bleeding, enlarged lymph nodes, angioedema, breast masses, and testicular masses.    Physical Exam  General:  Well-developed,well-nourished,in no acute distress; alert,appropriate and cooperative throughout examination;  90/62 Head:  Normocephalic and atraumatic without obvious abnormalities. No apparent alopecia or balding. Eyes:  No corneal or conjunctival inflammation noted. EOMI. Perrla. Funduscopic exam benign, without hemorrhages, exudates or papilledema. Vision grossly normal. Mouth:  Oral mucosa and oropharynx without lesions or exudates.  Teeth in good repair. Neck:  status post radical neck Lungs:  Normal respiratory effort, chest expands symmetrically. Lungs are clear to auscultation, no crackles or wheezes. Heart:  Normal rate and regular rhythm. S1 and S2 normal without gallop, murmur, click, rub or other extra sounds.   Impression & Recommendations:  Problem # 1:  URI (ICD-465.9)  His updated medication list for this problem includes:    Bufferin 325 Mg Tabs (Aspirin buf(cacarb-mgcarb-mgo)) .Marland Kitchen... 1 once daily    Alavert Allergy/sinus 5-120 Mg Tb12 (Loratadine-pseudoephedrine) ..... One twice daily as needed    Diclofenac Sodium 75 Mg Tbec (Diclofenac sodium) ..... One twice daily as needed for knee pain  His updated medication list for this problem includes:    Bufferin 325 Mg Tabs (Aspirin buf(cacarb-mgcarb-mgo)) .Marland Kitchen... 1 once daily    Alavert Allergy/sinus 5-120 Mg Tb12 (Loratadine-pseudoephedrine) ..... One twice daily as needed    Diclofenac Sodium 75 Mg Tbec (Diclofenac sodium) ..... One twice daily as needed for knee pain  Problem # 2:  HYPERTENSION NEC (ICD-997.91) presently hypotensive.  Will decrease his Weisbrod to 10 mg daily- Will hold his other medications until blood pressure has increased to 130/90  Complete Medication List: 1)  Valium 10 Mg Tabs (Diazepam) .... Take 1 tablet by mouth every six hours 2)  Acyclovir 200 Mg Caps (Acyclovir) .Marland Kitchen.. 1 qid as needed 3)  Viagra 100 Mg Tabs (Sildenafil citrate) .... As directed 4)  Bufferin 325 Mg Tabs (Aspirin buf(cacarb-mgcarb-mgo)) .Marland Kitchen.. 1 once daily 5)  Levothroid 100 Mcg Tabs (Levothyroxine sodium) .... One daily 6)  Alavert  Allergy/sinus 5-120 Mg Tb12 (Loratadine-pseudoephedrine) .... One twice daily as needed 7)  Lisinopril 40 Mg Tabs (lisinopril)  .... One daily 8)  Amlodipine Besylate 10 Mg Tabs (amlodipine Besylate)  .... One daily 9)  Chlorthalidone 25 Mg Tabs (Chlorthalidone) .... One daily 10)  Propoxyphene N-apap 100-650 Mg Tabs (Propoxyphene n-apap) .... One every 6 hours as needed for pain 11)  Diclofenac Sodium 75 Mg Tbec (Diclofenac sodium) .... One twice daily as needed for knee pain 12)  Bystolic 10 Mg Tabs (Nebivolol hcl) .... One daily  Patient Instructions: 1)  Get plenty of rest, drink lots of clear liquids, and use Tylenol or Ibuprofen for fever and comfort. Return in 7-10 days if you're not better:sooner if you're feeling worse. 2)  Bystolic 10 mg daily 3)  Hold BP meds until BP > 130/90 , then had one drug at a time 4)  Please schedule a follow-up appointment in 1 month. 5)  check blood pressure daily, before administering any medications

## 2010-05-21 NOTE — Letter (Signed)
Summary: [facility]  [facility]   Imported By: Maryln Gottron 03/27/2010 11:18:12  _____________________________________________________________________  External Attachment:    Type:   Image     Comment:   External Document

## 2010-05-21 NOTE — Progress Notes (Signed)
Summary: change of meds  Phone Note Call from Patient   Caller: Patient Call For: Gordy Savers  MD Summary of Call: Wants to change the Tramadol to something else.  Is concerned about the side effects that are listed, but did not have any adverse reactions. Complains of painful left shoulder and neck. [facility] Initial call taken by: Lynann Beaver CMA,  October 30, 2009 9:30 AM  Follow-up for Phone Call        OK to cont andinue unless side affects- all meds will have potential side affects Follow-up by: Gordy Savers  MD,  October 30, 2009 9:45 AM  Additional Follow-up for Phone Call Additional follow up Details #1::        Pt notified. Additional Follow-up by: Lynann Beaver CMA,  October 30, 2009 10:31 AM

## 2010-05-21 NOTE — Assessment & Plan Note (Signed)
Summary: EVAL OF LESION (SPOT) ON SHOULDER // RS   Vital Signs:  Patient profile:   55 year old male Weight:      194 pounds Temp:     97.9 degrees F oral BP sitting:   140 / 90  (right arm) Cuff size:   regular  Vitals Entered By: Duard Brady LPN (September 20, 1608 11:47 AM) CC: evaluation of "knot" on (L) shoulder Is Patient Diabetic? No   CC:  evaluation of "knot" on (L) shoulder.  History of Present Illness: 55 year old patient who has a history of head and neck cancer, status post left radical neck surgery.  He's also received adjunctive chemotherapy and radiotherapy.  He presents with a chief complaint of a prominent nodule involving the left shoulder region.  The examination revealed normal skin and bony architecture but  considerable muscle atrophy involving the shoulder region.  He has noticed some decreased strength in the left arm, but it is unclear how acute the muscle atrophy in his symptoms are.  Preventive Screening-Counseling & Management  Alcohol-Tobacco     Smoking Status: never  Allergies (verified): No Known Drug Allergies  Social History: Smoking Status:  never  Physical Exam  General:  Well-developed,well-nourished,in no acute distress; alert,appropriate and cooperative throughout examination; 130 or 84 Msk:  considerable atrophy of the left shoulder and deltoid area.  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  The patient also discussed with ENT and oncology.  Doubt this is a new or progressive problem  Problem # 2:  HYPERTENSION NEC (ICD-997.91)  Complete Medication List: 1)  Valium 10 Mg Tabs (Diazepam) .... Take 1 tablet by mouth every six hours 2)   Acyclovir 200 Mg Caps (Acyclovir) .Marland Kitchen.. 1 qid as needed 3)  Viagra 100 Mg Tabs (Sildenafil citrate) .... As directed 4)  Bufferin 325 Mg Tabs (Aspirin buf(cacarb-mgcarb-mgo)) .Marland Kitchen.. 1 once daily 5)  Levothroid 100 Mcg Tabs (Levothyroxine sodium) .... One daily 6)  Alavert Allergy/sinus 5-120 Mg Tb12 (Loratadine-pseudoephedrine) .... One twice daily as needed 7)  Diclofenac Sodium 75 Mg Tbec (Diclofenac sodium) .... One twice daily as needed for knee pain 8)  Tramadol Hcl 50 Mg Tabs (Tramadol hcl) .... Q8hrs prn 9)  Mucinex 600 Mg Xr12h-tab (Guaifenesin) .... Bid 10)  Bystolic 5 Mg Tabs (Nebivolol hcl) .... One daily  Patient Instructions: 1)  follow-up with oncology and ENT as planned 2)  call if there is any progressive muscle weakness or atrophy

## 2010-05-21 NOTE — Letter (Signed)
Summary: [facility]  [facility]   Imported By: Maryln Gottron 03/06/2010 15:58:31  _____________________________________________________________________  External Attachment:    Type:   Image     Comment:   External Document

## 2010-05-21 NOTE — Letter (Signed)
Summary: [facility]  [facility]   Imported By: Sherian Rein 03/05/2010 09:47:15  _____________________________________________________________________  External Attachment:    Type:   Image     Comment:   External Document

## 2010-05-21 NOTE — Assessment & Plan Note (Signed)
Summary: consult re: PET SCAN/CJR   Vital Signs:  Patient profile:   55 year old male Weight:      192 pounds Temp:     98.0 degrees F oral BP sitting:   130 / 72  (right arm) Cuff size:   regular  Vitals Entered By: Duard Brady LPN (November 19, 2009 10:29 AM) CC: conultation r/t PET scan results Is Patient Diabetic? No   CC:  conultation r/t PET scan results.  History of Present Illness: 55 year old patient who has a history of head and neck cancer and is followed by oncology.  He had  a recent PET scan performed that was unremarkable for metastatic disease.  The scan did reveal some nonspecific prominent amount is for the patient really denies no history of cough, fever, or sputum production.  He does treated hypertension.  He has hypothyroidism.  Allergies (verified): No Known Drug Allergies  Past History:  Past Medical History: Reviewed history from 08/17/2007 and no changes required. 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    Physical Exam  General:  Well-developed,well-nourished,in no acute distress; alert,appropriate and cooperative throughout examination Head:  Normocephalic and atraumatic without obvious abnormalities. No apparent alopecia or balding. Eyes:  No corneal or conjunctival  inflammation noted. EOMI. Perrla. Funduscopic exam benign, without hemorrhages, exudates or papilledema. Vision grossly normal. Mouth:  Oral mucosa and oropharynx without lesions or exudates.  Teeth in good repair. Neck:  status post left radical neck dissection Chest Wall:  No deformities, masses, tenderness or gynecomastia noted. Lungs:  Normal respiratory effort, chest expands symmetrically. Lungs are clear to auscultation, no crackles or wheezes. Heart:  Normal rate and regular rhythm. S1 and S2 normal without gallop, murmur, click, rub or other extra sounds. Abdomen:  Bowel sounds positive,abdomen soft and non-tender without masses, organomegaly or hernias noted.   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 Take 1 tablet by mouth every six hours 2)  Acyclovir 200 Mg Caps (Acyclovir) .Marland Kitchen.. 1 qid as needed 3)  Viagra 100 Mg Tabs (Sildenafil citrate) .... As directed 4)  Bufferin 325 Mg Tabs (Aspirin buf(cacarb-mgcarb-mgo)) .Marland Kitchen.. 1 once daily 5)  Levothroid 100 Mcg Tabs (Levothyroxine sodium) .... One daily 6)  Alavert Allergy/sinus 5-120 Mg Tb12 (Loratadine-pseudoephedrine) .... One twice daily as needed 7)  Diclofenac Sodium 75 Mg Tbec (Diclofenac sodium) .... One twice daily as needed for knee pain 8)  Tramadol Hcl 50 Mg Tabs (Tramadol hcl) .... Q8hrs prn 9)  Mucinex 600 Mg Xr12h-tab (Guaifenesin) .... Bid 10)  Bystolic 5 Mg Tabs (Nebivolol hcl) .... One daily  Patient Instructions: 1)  Limit your Sodium (Salt) to less than 2 grams a day(slightly less than 1/2 a teaspoon) to prevent fluid retention, swelling, or worsening of symptoms. 2)  It is important that you exercise regularly at least 20 minutes 5 times a week. If  you develop chest pain, have severe difficulty breathing, or feel very tired , stop exercising immediately and  seek medical attention. 3)  Check your Blood Pressure regularly. If it is above:  150/90 you should make an appointment.

## 2010-05-21 NOTE — Letter (Signed)
Summary: EGD Instructions  [facility]  [address] [place], [place] [address]   Phone: [phone]  Fax: [phone]       NOHA KARASIK    1955/12/19    MRN: [id number]       Procedure Day Dorna Bloom: Wednesday October 19th ,2011     Arrival Time:  1:30pm     Procedure Time: 2:30pm     Location of Procedure:                    _ x _ [facility] (4th Floor)    PREPARATION FOR ENDOSCOPY   On 02/06/10 THE DAY OF THE PROCEDURE:  1.   No solid foods, milk or milk products are allowed after midnight the night before your procedure.  2.   Do not drink anything colored red or purple.  Avoid juices with pulp.  No orange juice.  3.  You may drink clear liquids until12:30pm, which is 2 hours before your procedure.                                                                                                CLEAR LIQUIDS INCLUDE: Water Jello Ice Popsicles Tea (sugar ok, no milk/cream) Powdered fruit flavored drinks Coffee (sugar ok, no milk/cream) Gatorade Juice: apple, white grape, white cranberry  Lemonade Clear bullion, consomm, broth Carbonated beverages (any kind) Strained chicken noodle soup Hard Candy   MEDICATION INSTRUCTIONS  Unless otherwise instructed, you should take regular prescription medications with a small sip of water as early as possible the morning of your procedure.        OTHER INSTRUCTIONS  You will need a responsible adult at least 57 years of age to accompany you and drive you home.   This person must remain in the waiting room during your procedure.  Wear loose fitting clothing that is easily removed.  Leave jewelry and other valuables at home.  However, you may wish to bring a book to read or an iPod/MP3 player to listen to music as you wait for your procedure to start.  Remove all body piercing jewelry and leave at home.  Total time from sign-in until discharge is approximately 2-3 hours.  You should go home  directly after your procedure and rest.  You can resume normal activities the day after your procedure.  The day of your procedure you should not:   Drive   Make legal decisions   Operate machinery   Drink alcohol   Return to work  You will receive specific instructions about eating, activities and medications before you leave.    The above instructions have been reviewed and explained to me by   _______________________    I fully understand and can verbalize these instructions _____________________________ Date _________

## 2010-05-21 NOTE — Assessment & Plan Note (Signed)
Summary: 6 month rov/njr/PT REQ FLU SHOT/CJR   Vital Signs:  Patient profile:   55 year old male Weight:      168 pounds Temp:     98.5 degrees F oral BP sitting:   122 / 92  (left arm) Cuff size:   large  Vitals Entered By: Alfred Levins, CMA (February 15, 2010 8:32 AM) CC: flu   Primary Care Provider:  Gordy Savers, MD   CC:  flu.  History of Present Illness: 55 year old patient who is seen today for follow-up.  He is being followed closely.  GI due to weight loss, nausea, and vomiting, anorexia.  He is an upper panendoscopy and treated for candidal esophagitis.  He continues to smoke sporadically.  He does complain of some chest congestion, and minimally productive cough.  No fever or chills.  He does have a history of pneumonia in the past.  Does not complain much in the way of abdominal pain.  Today.  He has hypothyroidism and treated hypertension.  He is on omeprazole for reflux  Preventive Screening-Counseling & Management  Alcohol-Tobacco     Smoking Cessation Counseling: yes  Current Medications (verified): 1)  Valium 10 Mg Tabs (Diazepam) .... Take 1 Tablet By Mouth Every Six Hours 2)  Acyclovir 200 Mg  Caps (Acyclovir) .Marland Kitchen.. 1 Qid As Needed 3)  Viagra 100 Mg  Tabs (Sildenafil Citrate) .... As Directed 4)  Bufferin 325 Mg  Tabs (Aspirin Buf(Cacarb-Mgcarb-Mgo)) .Marland Kitchen.. 1 Once Daily 5)  Levothroid 100 Mcg  Tabs (Levothyroxine Sodium) .... One Daily 6)  Alavert Allergy/sinus 5-120 Mg  Tb12 (Loratadine-Pseudoephedrine) .... One Twice Daily As Needed 7)  Tramadol Hcl 50 Mg Tabs (Tramadol Hcl) .... Q8hrs Prn 8)  Mucinex 600 Mg Xr12h-Tab (Guaifenesin) .... Bid 9)  Bystolic 5 Mg Tabs (Nebivolol Hcl) .... One Daily 10)  Prochlorperazine Maleate 10 Mg Tabs (Prochlorperazine Maleate) .... One By Mouth Every 6 Hours As Needed For Nausea 11)  Boost  Liqd (Nutritional Supplements) .... 2-3 Daily 12)  Ferrous Sulfate 325 (65 Fe) Mg Tabs (Ferrous Sulfate) .... One Tablet  By Mouth Two Times A Day With Meals 13)  Omeprazole 40 Mg Cpdr (Omeprazole) .... One Capsule By Mouth Every Morning 14)  Diflucan 100 Mg Tabs (Fluconazole) .... One Tablet By Mouth Once Daily  Allergies (verified): No Known Drug Allergies  Past History:  Past Medical History: Reviewed history from 02/04/2010 and no changes required. 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 colonoscopy February 2006, 06-2009 negative  neck CT March 2008 status post left radical neck dissection and tonsillectomy status post radiation treatment with cis-platinum as a radiosensitizer left knee arthroscopic surgery February 1980 (, torn medial and lateral menisci)  Family History: Reviewed history from 02/04/2010 and no changes required. father died age 41, diabetes, coronary artery disease mother- history of diabetes, coronary disease, heart failure Brother is well-HTN skin Ca, obesity, gout, DM2 sister died in a motor vehicle accident Family History of Colon Cancer:MGM  Social History: Reviewed history from 02/04/2010 and no changes required. 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  The patient denies fever, weight gain, vision loss, decreased hearing, chest pain, syncope, dyspnea on exertion, peripheral edema, headaches, hemoptysis, melena, hematochezia, hematuria, incontinence, genital sores, suspicious skin lesions, transient blindness, difficulty  walking, unusual weight change, abnormal bleeding, enlarged lymph nodes, angioedema, and breast masses.    Physical Exam  General:  underweight appearing.  130/70underweight appearing.   Head:  Normocephalic and atraumatic without obvious abnormalities. No apparent alopecia or balding. Eyes:  No corneal or conjunctival inflammation noted. EOMI. Perrla. Funduscopic exam benign, without hemorrhages, exudates or papilledema. Vision grossly normal. Mouth:  Oral mucosa and oropharynx without lesions or exudates.  Teeth in good repair. Neck:  status post left radical neck dissection Lungs:  coarse rhonchi of all the lower half lung fields bilaterally O2 saturation 90% Heart:  Normal rate and regular rhythm. S1 and S2 normal without gallop, murmur, click, rub or other extra sounds.  pulse rate 100 Abdomen:  Bowel sounds positive,abdomen soft and non-tender without masses, organomegaly or hernias noted. Msk:  No deformity or scoliosis noted of thoracic or lumbar spine.   Pulses:  R and L carotid,radial,femoral,dorsalis pedis and posterior tibial pulses are full and equal bilaterally Extremities:  No clubbing, cyanosis, edema, or deformity noted with normal full range of motion of all joints.     Impression & Recommendations:  Problem # 1:  BRONCHITIS-ACUTE (ICD-466.0)  His updated medication list for this problem includes:    Alavert Allergy/sinus 5-120 Mg Tb12 (Loratadine-pseudoephedrine) ..... One twice daily as needed    Mucinex 600 Mg Xr12h-tab (Guaifenesin) ..... Bid    His updated medication list for this problem includes:    Alavert Allergy/sinus 5-120 Mg Tb12 (Loratadine-pseudoephedrine) ..... One twice daily as needed    Mucinex 600 Mg Xr12h-tab (Guaifenesin) ..... Bid  Problem # 2:  WEIGHT LOSS, ABNORMAL (ICD-783.21)  Problem # 3:  TOBACCO USE, QUIT (ICD-V15.82)  Problem # 4:  HYPERTENSION NEC (ICD-997.91)  Complete Medication List: 1)  Valium 10 Mg Tabs (Diazepam) ....  Take 1 tablet by mouth every six hours 2)  Acyclovir 200 Mg Caps (Acyclovir) .Marland Kitchen.. 1 qid as needed 3)  Viagra 100 Mg Tabs (Sildenafil citrate) .... As directed 4)  Bufferin 325 Mg Tabs (Aspirin buf(cacarb-mgcarb-mgo)) .Marland Kitchen.. 1 once daily 5)  Levothroid 100 Mcg Tabs (Levothyroxine sodium) .... One daily 6)  Alavert Allergy/sinus 5-120 Mg Tb12 (Loratadine-pseudoephedrine) .... One twice daily as needed 7)  Tramadol Hcl 50 Mg Tabs (Tramadol hcl) .... Q8hrs prn 8)  Mucinex 600 Mg Xr12h-tab (Guaifenesin) .... Bid 9)  Bystolic 5 Mg Tabs (Nebivolol hcl) .... One daily 10)  Prochlorperazine Maleate 10 Mg Tabs (Prochlorperazine maleate) .... One by mouth every 6 hours as needed for nausea 11)  Boost Liqd (Nutritional supplements) .... 2-3 daily 12)  Ferrous Sulfate 325 (65 Fe) Mg Tabs (Ferrous sulfate) .... One tablet by mouth two times a day with meals 13)  Omeprazole 40 Mg Cpdr (Omeprazole) .... One capsule by mouth every morning 14)  Diflucan 100 Mg Tabs (Fluconazole) .... One tablet by mouth once daily  Other Orders: T-2 View CXR (71020TC)  Patient Instructions: 1)  Get plenty of rest, drink lots of clear liquids, and use Tylenol for fever and comfort. Return in 7-10 days if you're not better:sooner if you're feeling worse. 2)  Please schedule a follow-up appointment in 3 months. 3)  Limit your Sodium (Salt) to less than 2 grams a day(slightly less than 1/2 a teaspoon) to prevent fluid retention, swelling,  or worsening of symptoms. 4)  It is important that you exercise regularly at least 20 minutes 5 times a week. If you develop chest pain, have severe difficulty breathing, or feel very tired , stop exercising immediately and seek medical attention. 5)  Tobacco is very bad for your health and your loved ones! You Should stop smoking!. Prescriptions: OMEPRAZOLE 40 MG CPDR (OMEPRAZOLE) one capsule by mouth every morning  #30 x 11   Entered and Authorized by:   Gordy Savers  MD   Signed by:    Gordy Savers  MD on 02/15/2010   Method used:   Electronically to        [facility] (retail)       [address]       [place], [place]  [address]       Ph: [phone] or [phone]       Fax: [phone]   RxID:   [id number] BYSTOLIC 5 MG TABS (NEBIVOLOL HCL) one daily  #90 x 3   Entered and Authorized by:   Gordy Savers  MD   Signed by:   Gordy Savers  MD on 02/15/2010   Method used:   Electronically to        [facility] (retail)       [address]       [place], [place]  [address]       Ph: [phone] or [phone]       Fax: [phone]   RxID:   [id number] TRAMADOL HCL 50 MG TABS (TRAMADOL HCL) q8hrs prn  #50 x 6   Entered and Authorized by:   Gordy Savers  MD   Signed by:   Gordy Savers  MD on 02/15/2010   Method used:   Electronically to        [facility] (retail)       [address]       [place], [place]  [address]       Ph: [phone] or [phone]       Fax: [phone]   RxID:   [id number] ALAVERT ALLERGY/SINUS 5-120 MG  TB12 (LORATADINE-PSEUDOEPHEDRINE) one twice daily as needed  #48 x 4   Entered and Authorized by:   Gordy Savers  MD   Signed by:   Gordy Savers  MD on 02/15/2010   Method used:   Electronically to        [facility] (retail)       [address]       [place], [place]  [address]       Ph: [phone] or [phone]       Fax: [phone]   RxID:   [id number] LEVOTHROID 100 MCG  TABS (LEVOTHYROXINE SODIUM) one daily  #90 Tablet x 1   Entered and Authorized by:   Gordy Savers  MD   Signed by:   Gordy Savers  MD on 02/15/2010   Method used:   Electronically to        [facility] (retail)       [address]       [place], [place]  [address]       Ph: [phone] or [phone]       Fax: [phone]   RxID:   [id number] VIAGRA 100 MG  TABS (SILDENAFIL CITRATE) as directed  #12 x 6   Entered and Authorized by:   Janett Labella  Amador Cunas  MD   Signed by:   Gordy Savers  MD on 02/15/2010   Method used:   Electronically to        [facility] (retail)       [address]       [place], [place]  [address]       Ph: [phone] or [phone]       Fax: [phone]   RxID:   [id number] ACYCLOVIR 200 MG  CAPS (ACYCLOVIR) 1 qid as needed  #50 x 6   Entered and Authorized by:   Gordy Savers  MD   Signed by:   Gordy Savers  MD on 02/15/2010   Method used:   Electronically to        [facility] (retail)       [address]       [place], [place]  [address]       Ph: [phone] or [phone]       Fax: [phone]   RxID:   [id number]    Orders Added: 1)  Est. Patient Level IV [[address]] 2)  T-2 View CXR [71020TC]

## 2010-05-21 NOTE — Progress Notes (Signed)
Summary: Stop Taking Diclofenac  Phone Note Outgoing Call   Call placed by: Kathrynn Speed CMA,  December 06, 2009 8:32 AM Summary of Call: Stop  taking Diclofenac & do not take any Advil or Avele.   Follow-up for Phone Call        called pt - informed that Dr. Amador Cunas wants him to stop taking Diclofenac, also no aleve or advil at this time. Call if having problems. KIK Follow-up by: Duard Brady LPN,  December 06, 2009 11:20 AM

## 2010-05-21 NOTE — Letter (Signed)
Summary: [facility] Instructions  [facility]  [address] [place], [place] [address]   Phone: [phone]  Fax: [phone]       Juan Webb    [birth year]    MRN: [id number]        Procedure Day /Date:  07/13/09  Friday     Arrival Time:  1:30pm         Procedure Time: 2:30pm     Location of Procedure:                    _x _  [facility] (4th Floor)   PREPARATION FOR COLONOSCOPY WITH MOVIPREP   Starting 5 days prior to your procedure _ 3/20/11_ do not eat nuts, seeds, popcorn, corn, beans, peas,  salads, or any raw vegetables.  Do not take any fiber supplements (e.g. Metamucil, Citrucel, and Benefiber).  THE DAY BEFORE YOUR PROCEDURE         DATE:   07/12/09  DAY:  Thursday  1.  Drink clear liquids the entire day-NO SOLID FOOD  2.  Do not drink anything colored red or purple.  Avoid juices with pulp.  No orange juice.  3.  Drink at least 64 oz. (8 glasses) of fluid/clear liquids during the day to prevent dehydration and help the prep work efficiently.  CLEAR LIQUIDS INCLUDE: Water Jello Ice Popsicles Tea (sugar ok, no milk/cream) Powdered fruit flavored drinks Coffee (sugar ok, no milk/cream) Gatorade Juice: apple, white grape, white cranberry  Lemonade Clear bullion, consomm, broth Carbonated beverages (any kind) Strained chicken noodle soup Hard Candy                             4.  In the morning, mix first dose of MoviPrep solution:    Empty 1 Pouch A and 1 Pouch B into the disposable container    Add lukewarm drinking water to the top line of the container. Mix to dissolve    Refrigerate (mixed solution should be used within 24 hrs)  5.  Begin drinking the prep at 5:00 p.m. The MoviPrep container is divided by 4 marks.   Every 15 minutes drink the solution down to the next mark (approximately 8 oz) until the full liter is complete.   6.  Follow completed prep with 16 oz of clear liquid of your choice (Nothing red or  purple).  Continue to drink clear liquids until bedtime.  7.  Before going to bed, mix second dose of MoviPrep solution:    Empty 1 Pouch A and 1 Pouch B into the disposable container    Add lukewarm drinking water to the top line of the container. Mix to dissolve    Refrigerate  THE DAY OF YOUR PROCEDURE      DATE:   07/13/09 DAY:   Friday  Beginning at 9:30 a.m. (5 hours before procedure):         1. Every 15 minutes, drink the solution down to the next mark (approx 8 oz) until the full liter is complete.  2. Follow completed prep with 16 oz. of clear liquid of your choice.    3. You may drink clear liquids until 12:30pm (2 HOURS BEFORE PROCEDURE).   MEDICATION INSTRUCTIONS  Unless otherwise instructed, you should take regular prescription medications with a small sip of water   as early as possible the morning of your procedure.       OTHER  INSTRUCTIONS  You will need a responsible adult at least 57 years of age to accompany you and drive you home.   This person must remain in the waiting room during your procedure.  Wear loose fitting clothing that is easily removed.  Leave jewelry and other valuables at home.  However, you may wish to bring a book to read or  an iPod/MP3 player to listen to music as you wait for your procedure to start.  Remove all body piercing jewelry and leave at home.  Total time from sign-in until discharge is approximately 2-3 hours.  You should go home directly after your procedure and rest.  You can resume normal activities the  day after your procedure.  The day of your procedure you should not:   Drive   Make legal decisions   Operate machinery   Drink alcohol   Return to work  You will receive specific instructions about eating, activities and medications before you leave.    The above instructions have been reviewed and explained to me by  Ezra Sites RN  June 29, 2009 1:56 PM     I fully understand and can verbalize  these instructions _____________________________ Date _________

## 2010-05-21 NOTE — Progress Notes (Signed)
Summary: refill valium  Phone Note Refill Request Message from:  Fax from Pharmacy on March 12, 2010 12:00 PM  Refills Requested: Medication #1:  VALIUM 10 MG TABS Take 1 tablet by mouth every six hours   Last Refilled: 10/17/[facility] [address]    Method Requested: Fax to Local Pharmacy Initial call taken by: Duard Brady LPN,  March 12, 2010 12:01 PM    Prescriptions: VALIUM 10 MG TABS (DIAZEPAM) Take 1 tablet by mouth every six hours  #90 x 1   Entered by:   Duard Brady LPN   Authorized by:   Gordy Savers  MD   Signed by:   Duard Brady LPN on 02/15/2535   Method used:   Historical   RxID:   [id number]  faxed back to [facility]

## 2010-05-21 NOTE — Procedures (Signed)
Summary: Colonoscopy  Patient: Zi Newbury Note: All result statuses are Final unless otherwise noted.  Tests: (1) Colonoscopy (COL)   COL Colonoscopy           DONE     [facility]     [address]     [place], [place]  [address]           COLONOSCOPY PROCEDURE REPORT           PATIENT:  Juan Webb, Juan Webb  MR#:  [id number]     BIRTHDATE:  1956-03-09, 53 yrs. old  GENDER:  male           ENDOSCOPIST:  Judie Petit T. Russella Dar, MD, [facility]           PROCEDURE DATE:  07/13/2009     PROCEDURE:  Colonoscopy with snare polypectomy     ASA CLASS:  Class II     INDICATIONS:  1) follow-up of polyp, adenomatous polyp, 05/2004.           MEDICATIONS:   Fentanyl 150 mcg IV, Versed 14 mg IV, Benadryl 50     mg IV           DESCRIPTION OF PROCEDURE:   After the risks benefits and     alternatives of the procedure were thoroughly explained, informed     consent was obtained.  Digital rectal exam was performed and     revealed no abnormalities.   The LB [id number] endoscope     was introduced through the anus and advanced to the cecum, which     was identified by both the appendix and ileocecal valve, limited     by fair prep, a tortuous and redundant colon.    The quality of     the prep was Moviprep fair, adequate.  Extensive rinsing and     suctioning was required achieve a fair prep. The instrument was     then slowly withdrawn as the colon was fully examined.     <<PROCEDUREIMAGES>>           FINDINGS:  A sessile polyp was found in the sigmoid colon. It was     5 mm in size. Polyp was snared without cautery. Retrieval was not     successful. Mild diverticulosis was found in the sigmoid to     descending colon segments.  A normal appearing cecum, ileocecal     valve, and appendiceal orifice were identified. The ascending,     hepatic flexure, transverse, splenic flexure, and rectum appeared     unremarkable. Retroflexed views in the rectum revealed internal     hemorrhoids,  moderate.  The time to cecum =  18  minutes. The     scope was then withdrawn (time =  13.75  min) from the patient and     the procedure completed.           COMPLICATIONS:  None           ENDOSCOPIC IMPRESSION:     1) 5 mm sessile polyp in the sigmoid colon     2) Mild diverticulosis in the sigmoid to descending colon     segments     3) Internal hemorrhoids           RECOMMENDATIONS:     1) More extensive bowel prep at next colonoscopy     2) High fiber diet with liberal fluid intake.     3) Repeat Colonoscopy in 5 years.  [name]. Russella Dar, MD, [facility]           CC: Gordy Savers, MD           n.     [name]. Rashaan Wyles at 07/13/2009 03:12 PM           [name], [id number]  Note: An exclamation mark (!) indicates a result that was not dispersed into the flowsheet. Document Creation Date: 07/13/2009 3:13 PM _______________________________________________________________________  (1) Order result status: Final Collection or observation date-time: 07/13/2009 15:02 Requested date-time:  Receipt date-time:  Reported date-time:  Referring Physician:   Ordering Physician: Claudette Head [phone]) Specimen Source:  Source: Launa Grill Order Number: [id number] Lab site:   Appended Document: Colonoscopy     Procedures Next Due Date:    Colonoscopy: 06/2014

## 2010-05-21 NOTE — Miscellaneous (Signed)
Summary: [facility]  Clinical Lists Changes  Medications: Added new medication of MOVIPREP 100 GM  SOLR (PEG-KCL-NACL-NASULF-NA ASC-C) As per prep instructions. - Signed Rx of MOVIPREP 100 GM  SOLR (PEG-KCL-NACL-NASULF-NA ASC-C) As per prep instructions.;  #1 x 0;  Signed;  Entered by: Ezra Sites RN;  Authorized by: Meryl Dare MD [facility];  Method used: Electronically to [facility] [address] [place], [place]  [address], Ph: [phone] or [phone], Fax: [phone] Observations: Added new observation of NKA: T (06/29/2009 13:24)    Prescriptions: MOVIPREP 100 GM  SOLR (PEG-KCL-NACL-NASULF-NA ASC-C) As per prep instructions.  #1 x 0   Entered by:   Ezra Sites RN   Authorized by:   Meryl Dare MD [facility]   Signed by:   Ezra Sites RN on 06/29/2009   Method used:   Electronically to        [facility] (retail)       [address]       [place], [place]  [address]       Ph: [phone] or [phone]       Fax: [phone]   RxID:   [id number]

## 2010-05-21 NOTE — Assessment & Plan Note (Signed)
Summary: cpx/njr   Vital Signs:  Patient profile:   55 year old male Height:      76.5 inches Weight:      196 pounds BMI:     23.63 Temp:     97.8 degrees F oral BP sitting:   120 / 72  (right arm) Cuff size:   regular  Vitals Entered By: Duard Brady LPN (Aug 25, 7251 9:32 AM) CC: cpx - doing well , labs done Is Patient Diabetic? No   CC:  cpx - doing well  and labs done.  History of Present Illness: 55 year old patient who is seen today for a health maintenance exam.  Medical problems include a history of head and neck cancer.  He is followed by oncology.  He has treated hypertension, hypothyroidism, and a history of colonic polyps.  He is at a recent follow-up colonoscopy.  He has multiple somatic complaints and has recently  retired from the [employer].  He lives with his elderly mother  Preventive Screening-Counseling & Management  Alcohol-Tobacco     Smoking Status: quit  Caffeine-Diet-Exercise     Does Patient Exercise: yes  Allergies (verified): No Known Drug Allergies  Past History:  Past Medical History: Reviewed history from 08/17/2007 and no changes required. 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 father died age 41, diabetes, coronary artery disease mother- history of diabetes, coronary disease, heart failure  Brother is well-HTN skin Ca, obesity, gout, DM2 sister died in a motor vehicle accident  Social History: Reviewed history from  08/17/2007 and no changes required. 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 Regular exercise-yes remote tobacco useDoes Patient Exercise:  yes  Review of Systems  The patient denies anorexia, fever, weight loss, weight gain, vision loss, decreased hearing, hoarseness, chest pain, syncope, dyspnea on exertion, peripheral edema, prolonged cough, headaches, hemoptysis, abdominal pain, melena, hematochezia, severe indigestion/heartburn, hematuria, incontinence, genital sores, muscle weakness, suspicious skin lesions, transient blindness, difficulty walking, depression, unusual weight change, abnormal bleeding, enlarged lymph nodes, angioedema, breast masses, and testicular masses.    Physical Exam  General:  Well-developed,well-nourished,in no acute distress; alert,appropriate and cooperative throughout examination Head:  Normocephalic and atraumatic without obvious abnormalities. No apparent alopecia or balding. Eyes:  No corneal or conjunctival inflammation noted. EOMI. Perrla. Funduscopic exam benign, without hemorrhages, exudates or papilledema. Vision grossly normal. Ears:  External ear exam shows no significant lesions or deformities.  Otoscopic examination reveals clear canals, tympanic membranes are intact bilaterally without bulging, retraction, inflammation or discharge. Hearing is grossly normal bilaterally. Nose:  External nasal examination shows no deformity or inflammation. Nasal mucosa are pink and moist without lesions or exudates. Mouth:  Oral mucosa and oropharynx without lesions or exudates.  Teeth in good repair. Neck:  status post left radical neck Chest Wall:  No deformities, masses, tenderness or gynecomastia noted. Breasts:  No masses or gynecomastia noted Lungs:  Normal respiratory effort, chest expands symmetrically. Lungs are clear to auscultation, no crackles or wheezes. Heart:  Normal rate and regular rhythm. S1 and S2 normal without gallop, murmur, click, rub or other extra  sounds.  Abdomen:  Bowel sounds positive,abdomen soft and non-tender without masses, organomegaly or hernias noted. Rectal:  No external abnormalities noted. Normal sphincter tone. No rectal masses or tenderness. Genitalia:  Testes bilaterally descended without nodularity, tenderness or masses. No scrotal masses or lesions. No penis lesions or urethral discharge. Prostate:  1+ enlarged.  1+ enlarged.   Msk:  No deformity or scoliosis noted of thoracic or lumbar spine.   Pulses:  R and L carotid,radial,femoral,dorsalis pedis and posterior tibial pulses are full and equal bilaterally Extremities:  No clubbing, cyanosis, edema, or deformity noted with normal full range of motion of all joints.   Neurologic:  alert & oriented X3, cranial nerves II-XII intact, strength normal in all extremities, sensation intact to light touch, sensation intact to pinprick, gait normal, finger-to-nose normal, and heel-to-shin normal.  alert & oriented X3, cranial nerves II-XII intact, strength normal in all extremities, sensation intact to light touch, sensation intact to pinprick, gait normal, finger-to-nose normal, and heel-to-shin normal.   Skin:  Intact without suspicious lesions or rashes Cervical Nodes:  No lymphadenopathy noted Axillary Nodes:  No palpable lymphadenopathy Inguinal Nodes:  No significant adenopathy Psych:  Cognition and judgment appear intact. Alert and cooperative with normal attention span and concentration. No apparent delusions, illusions, hallucinations   Impression & Recommendations:  Problem # 1:  Preventive Health Care (ICD-V70.0)  Complete Medication List: 1)  Valium 10 Mg Tabs (Diazepam) .... Take 1 tablet by mouth every six hours 2)  Acyclovir 200 Mg Caps (Acyclovir) .Marland Kitchen.. 1 qid as needed 3)  Viagra 100 Mg Tabs (Sildenafil citrate) .... As directed 4)  Bufferin 325 Mg Tabs (Aspirin buf(cacarb-mgcarb-mgo)) .Marland Kitchen.. 1 once daily 5)  Levothroid 100 Mcg Tabs (Levothyroxine sodium) .... One  daily 6)  Alavert Allergy/sinus 5-120 Mg Tb12 (Loratadine-pseudoephedrine) .... One twice daily as needed 7)  Diclofenac Sodium 75 Mg Tbec (Diclofenac sodium) .... One twice daily as needed for knee pain 8)  Tramadol Hcl 50 Mg Tabs (Tramadol hcl) .... Prn 9)  Mucinex 600 Mg Xr12h-tab (Guaifenesin) .... Bid 10)  Bystolic 5 Mg Tabs (Nebivolol hcl) .... One daily  Patient Instructions: 1)  Please schedule a follow-up appointment in 6 months. 2)  Limit your Sodium (Salt) to less than 2 grams a day(slightly less than 1/2 a teaspoon) to prevent fluid retention, swelling, or worsening of symptoms. 3)  It is important that you exercise regularly at least 20 minutes 5 times a week. If you develop chest pain, have severe difficulty breathing, or feel very tired , stop exercising immediately and seek medical attention. 4)  Check your Blood Pressure regularly. If it is above: 150/90 you should make an appointment. Prescriptions: BYSTOLIC 5 MG TABS (NEBIVOLOL HCL) one daily  #90 x 3   Entered and Authorized by:   Gordy Savers  MD   Signed by:   Gordy Savers  MD on 08/24/2009   Method used:   Print then Give to Patient   RxID:   [id number] TRAMADOL HCL 50 MG TABS (TRAMADOL HCL) prn  #50 x 6   Entered and Authorized by:   Gordy Savers  MD   Signed by:   Gordy Savers  MD on 08/24/2009   Method used:   Print then Give to Patient   RxID:   [id number] DICLOFENAC SODIUM 75 MG TBEC (DICLOFENAC SODIUM) one twice daily as needed for knee pain  #50 x 6   Entered and Authorized by:   Gordy Savers  MD  Signed by:   Gordy Savers  MD on 08/24/2009   Method used:   Print then Give to Patient   RxID:   [id number] LEVOTHROID 100 MCG  TABS (LEVOTHYROXINE SODIUM) one daily  #90 Tablet x 2   Entered and Authorized by:   Gordy Savers  MD   Signed by:   Gordy Savers  MD on 08/24/2009   Method used:   Print then Give to Patient   RxID:    [id number] VIAGRA 100 MG  TABS (SILDENAFIL CITRATE) as directed  #12 x 6   Entered and Authorized by:   Gordy Savers  MD   Signed by:   Gordy Savers  MD on 08/24/2009   Method used:   Print then Give to Patient   RxID:   [id number] ACYCLOVIR 200 MG  CAPS (ACYCLOVIR) 1 qid as needed  #50 x 6   Entered and Authorized by:   Gordy Savers  MD   Signed by:   Gordy Savers  MD on 08/24/2009   Method used:   Print then Give to Patient   RxID:   [id number] VALIUM 10 MG TABS (DIAZEPAM) Take 1 tablet by mouth every six hours  #90 x 3   Entered and Authorized by:   Gordy Savers  MD   Signed by:   Gordy Savers  MD on 08/24/2009   Method used:   Print then Give to Patient   RxID:   [id number]

## 2010-05-21 NOTE — Assessment & Plan Note (Signed)
Summary: FLU SHOT//SLM   Nurse Visit   Allergies: No Known Drug Allergies  Orders Added: 1)  Admin 1st Vaccine [[address]71] 2)  Flu Vaccine 25yrs + [[address]] Flu Vaccine Consent Questions     Do you have a history of severe allergic reactions to this vaccine? no    Any prior history of allergic reactions to egg and/or gelatin? no    Do you have a sensitivity to the preservative Thimersol? no    Do you have a past history of Guillan-Barre Syndrome? no    Do you currently have an acute febrile illness? no    Have you ever had a severe reaction to latex? no    Vaccine information given and explained to patient? yes    Are you currently pregnant? no    Lot Number:[id number]   Exp Date:10/19/2010   Site Given  Left Deltoid IM .lbflu1

## 2010-05-21 NOTE — Letter (Signed)
Summary: Colonoscopy Letter  [facility]  [address] [place], [place] [address]   Phone: [phone]  Fax: [phone]      May 02, 2009 MRN: [id number]   Juan Webb [address] [place], [place]  [address]   Dear Mr. HELM,   According to your medical record, it is time for you to schedule a Colonoscopy. The American Cancer Society recommends this procedure as a method to detect early colon cancer. Patients with a family history of colon cancer, or a personal history of colon polyps or inflammatory bowel disease are at increased risk.  This letter has beeen generated based on the recommendations made at the time of your procedure. If you feel that in your particular situation this may no longer apply, please contact our office.  Please call our office at [phone] to schedule this appointment or to update your records at your earliest convenience.  Thank you for cooperating with [place] to provide you with the very best care possible.   Sincerely,  Judie Petit T. Russella Dar, M.D.  [facility] [phone]

## 2010-05-21 NOTE — Letter (Signed)
[facility]   Imported By: Maryln Gottron 12/10/2009 14:46:43  _____________________________________________________________________  External Attachment:    Type:   Image     Comment:   External Document

## 2010-05-22 DEATH — deceased

## 2010-05-23 NOTE — Assessment & Plan Note (Signed)
Summary: Pulmonary/ ext f/u summary ov   Copy to:  Dr. Fritz Webb Juan Webb/Referring Juan Webb:  Juan Webb, Juan Webb   CC:  Chest congestion- better.  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  rec no aspirin until no blood at all for 3 days then try 81 mg daily but stop this also if bleeding recurs Levaquin 750 x 5 day Swallow eval abn > exercises, no diet change  April 03, 2010 ov cc cough / congestion transiently better p levaquin. swallowing no better.  no purulent sputum at present.  Pt denies any significant  itching, sneezing,  nasal congestion or excess secretions,  fever, chills, sweats, unintended wt loss, pleuritic or exertional cp, hempoptysis, change in activity tolerance  orthopnea pnd or leg swelling. Pt also denies any obvious fluctuation in symptoms with weather or environmental change or other alleviating or aggravating factors.            Current Medications (verified): 1)  Valium 10 Mg Tabs (Diazepam) .... Take 1 Tablet By Mouth Every Six Hours 2)  Acyclovir 200 Mg  Caps (Acyclovir) .Marland Kitchen.. 1 Four Times A Day As Needed 3)  Viagra 100 Mg  Tabs (Sildenafil Citrate) .... As Directed 4)  Levothroid 100 Mcg  Tabs (Levothyroxine Sodium) .... One Daily 5)  Tramadol Hcl 50 Mg Tabs (Tramadol Hcl) .Marland Kitchen.. 1 Every 8 Hrs As Needed 6)  Mucinex 600 Mg Xr12h-Tab (Guaifenesin) .Marland Kitchen.. 1 Two Times A Day 7)  Bystolic 5 Mg Tabs (Nebivolol  Hcl) .... One Daily 8)  Prochlorperazine Maleate 10 Mg Tabs (Prochlorperazine Maleate) .... One By Mouth Every 6 Hours As Needed For Nausea 9)  Ferrous Sulfate 325 (65 Fe) Mg Tabs (Ferrous Sulfate) .... One Tablet By Mouth Two Times A Day With Meals 10)  Omeprazole 40 Mg Cpdr (Omeprazole) .... One Capsule By Mouth Every Morning 11)  Megestrol Acetate 40 Mg Tabs (Megestrol Acetate) .Marland Kitchen.. 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, February 2004 history of herpes genitalis Adenomatous Colon Polyps 05/2004 Hemorrhoids Diverticulosis Anxiety Disorder Arthritis Esophagitis      - EGD Pos esophagitis 10/119/11 with pos bx for candida Pulmonary infiltrates..................................Marland KitchenWert      - HIV neg 03/18/10      - Swallowing eval 03/29/10:  nothing to offer       - Desats with walking 50 ft April 03, 2010 but declined 02         Vital Signs:  Patient profile:   55 year old male Weight:      177 pounds O2 Sat:      90 % on 4 L/min Temp:     98.2 degrees F oral Pulse rate:   89 / minute BP sitting:   114 / 86  (left arm)  Vitals Entered By: Juan Webb (April 03, 2010 10:53 AM)  O2 Flow:  4 L/min  O2 Sat Comments o2 sat when pt arrived was 81%ra- this increased to 90% after being placed on 4liters o2   Physical Exam  Additional Exam:  wt 168 > 178 March 18 2010 > 177 April 03, 2010  Pleasant alert wm mod chronically ill appearing HEENT: nl dentition, turbinates, and orophanx. Nl external ear canals without cough reflex NECK :  s/p Left radial neck, no obvious nodes  LUNGS: no acc muscle use, clear to A and P bilaterally without cough on insp or exp maneuvers CV:  RRR  no s3 or murmur or increase in P2, no edema  ABD:  soft and nontender with nl excursion in the supine position. No  bruits or organomegaly, bowel sounds nl MS:  warm without deformities, calf tenderness, cyanosis or clubbing        Impression & Recommendations:  Problem # 1:  PULMONARY INFILTRATE INCLUDES (EOSINOPHILIA) (ICD-518.3)  assoc with hypoxemic with ex but declines 02  Most likely he is having chronic aspiration but not clear this is the cause of his infiltrates as they don't appear anatomically classic for  chronic aspiration.  Atypical TB is high on the ddx as is Boop which can cause nodular pattern but not discolored sputum.    Most likely this is recurrent asp though atypical pattern.  Discussed in detail all the  indications, usual  risks and alternatives  relative to the benefits with patient who agrees to proceed with cycles of levaquin as needed purulent sputum. See instructions for specific recommendations      Medications Added to Medication List This Visit: 1)  Omeprazole 40 Mg Cpdr (Omeprazole) .... Take  one 30-60 min before first meal of the day 2)  Levaquin 750 Mg Tabs (Levofloxacin) .... One tablet by mouth daily x 5 days  Other Orders: T-2 View CXR (71020TC) Est. Patient Level IV (81191)  Patient Instructions: 1)  GERD (REFLUX)  is a common cause of respiratory symptoms. It commonly presents without heartburn and can be treated with medication, but also with lifestyle changes including avoidance of late meals, excessive alcohol, smoking cessation, and avoid fatty foods, chocolate, peppermint, colas, red wine, and acidic juices such as orange juice. NO MINT OR MENTHOL PRODUCTS SO NO COUGH DROPS  2)  USE SUGARLESS CANDY INSTEAD (jolley ranchers)  3)  NO OIL BASED VITAMINS  4)  Use levaquin 750 once daily x 5 day cycles for nasty mucus 5)  Please schedule a follow-up appointment in 6 weeks, sooner if needed  Prescriptions: LEVAQUIN 750 MG  TABS (LEVOFLOXACIN) One tablet by mouth daily x 5 days  #5 x 11   Entered and Authorized by:   Nyoka Cowden Juan Webb   Signed by:    Nyoka Cowden Juan Webb on 04/03/2010   Method used:   Electronically to        [id number] (retail)       [address]       [place], [place]  [address]       Ph: [phone] or [phone]       Fax: [phone]   RxID:   [id number]

## 2010-05-23 NOTE — Progress Notes (Signed)
Summary: fyi  Phone Note Call from Patient   Caller: Patient Call For: Gordy Savers  MD Summary of Call: pt passed away 2010-06-01. Pt would like doc to call her back Initial call taken by: Heron Sabins,  May 15, 2010 2:31 PM  Follow-up for Phone Call        called and discussed Follow-up by: Gordy Savers  MD,  May 16, 2010 9:39 AM

## 2010-05-23 NOTE — Progress Notes (Signed)
Summary: refill omeprazole  Phone Note Refill Request Message from:  Fax from Pharmacy on April 09, 2010 5:16 PM  Refills Requested: Medication #1:  OMEPRAZOLE 40 MG CPDR Take  one 30-60 min before first meal of the day [facility]   Method Requested: Fax to Local Pharmacy Initial call taken by: Duard Brady LPN,  April 09, 2010 5:16 PM    Prescriptions: OMEPRAZOLE 40 MG CPDR (OMEPRAZOLE) Take  one 30-60 min before first meal of the day  #90 x 3   Entered by:   Duard Brady LPN   Authorized by:   Gordy Savers  MD   Signed by:   Duard Brady LPN on 81/19/1478   Method used:   Faxed to ...       [id number] (retail)       [address]       [place], [place]  [address]       Ph: [phone] or [phone]       Fax: [phone]   RxID:   [id number]

## 2010-05-23 NOTE — Progress Notes (Signed)
Summary: nos appt  Phone Note Call from Patient   Caller: [email or web address]  Call For: Eliasar Hlavaty Summary of Call: Pt is deceased. Initial call taken by: Darletta Moll,  May 17, 2010 10:49 AM

## 2010-05-23 NOTE — Miscellaneous (Signed)
Summary: Orders Update  Clinical Lists Changes  Orders: Added new Test order of T-2 View CXR (71020TC) - Signed 

## 2010-05-23 NOTE — Assessment & Plan Note (Signed)
Summary: F/U APPT...LSW.   History of Present Illness Visit Type: Follow-up Visit Primary GI MD: Elie Goody MD [facility] Primary Provider: Gordy Savers, MD  Requesting Provider: Gordy Savers, MD  Chief Complaint: Dysphagia, solids and liquids History of Present Illness:   [name] returns for followup of dysphagia and iron deficiency anemia. He has persistent problems with choking and coughing during and after eating. Modified barium swallow study revealed severe pharyngeal dysphagia, likely related to prior cancer therapy. He states he has gained weight.   GI Review of Systems    Reports acid reflux, chest pain, dysphagia with liquids, and  dysphagia with solids.      Denies abdominal pain, belching, bloating, heartburn, loss of appetite, nausea, vomiting, vomiting blood, weight loss, and  weight gain.        Denies anal fissure, black tarry stools, change in bowel habit, constipation, diarrhea, diverticulosis, fecal incontinence, heme positive stool, hemorrhoids, irritable bowel syndrome, jaundice, light color stool, liver problems, rectal bleeding, and  rectal pain.   Current Medications (verified): 1)  Valium 10 Mg Tabs (Diazepam) .... Take 1 Tablet By Mouth Every Six Hours 2)  Acyclovir 200 Mg  Caps (Acyclovir) .Marland Kitchen.. 1 Four Times A Day As Needed 3)  Viagra 100 Mg  Tabs (Sildenafil Citrate) .... As Directed 4)  Levothroid 100 Mcg  Tabs (Levothyroxine Sodium) .... One Daily 5)  Tramadol Hcl 50 Mg Tabs (Tramadol Hcl) .Marland Kitchen.. 1 Every 8 Hrs As Needed 6)  Mucinex 600 Mg Xr12h-Tab (Guaifenesin) .Marland Kitchen.. 1 Two Times A Day 7)  Bystolic 5 Mg Tabs (Nebivolol Hcl) .... One Daily 8)  Prochlorperazine Maleate 10 Mg Tabs (Prochlorperazine Maleate) .... One By Mouth Every 6 Hours As Needed For Nausea 9)  Ferrous Sulfate 325 (65 Fe) Mg Tabs (Ferrous Sulfate) .... One Tablet By Mouth Two Times A Day With Meals 10)  Omeprazole 40 Mg Cpdr (Omeprazole) .... Take  One 30-60 Min Before First  Meal of The Day 11)  Megestrol Acetate 40 Mg Tabs (Megestrol Acetate) .Marland Kitchen.. 1 Three Times A Day 12)  Levaquin 750 Mg  Tabs (Levofloxacin) .... One Tablet By Mouth Daily X 5 Days  Allergies (verified): No Known Drug Allergies  Past History:  Past Medical History: Reviewed history from 04/03/2010 and no changes required. 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 colonoscopy February 2006, 06-2009 negative  neck CT March 2008 status post left radical neck dissection and tonsillectomy status post radiation treatment with cis-platinum as a radiosensitizer left knee arthroscopic surgery February 1980 (, torn medial and lateral menisci)  Family History: Reviewed history from 02/04/2010 and no changes required. father died age 35, diabetes, coronary artery disease mother- history of diabetes, coronary disease, heart failure Brother is well-HTN skin Ca, obesity, gout, DM2 sister died in a motor vehicle accident Family History of Colon Cancer:MGM  Social History: Reviewed history from 03/18/2010 and no changes required. 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  Quit smoking cigs in 2001- smoked for 30 yrs up to 2 ppd. Alcohol Use - no Daily Caffeine Use: 3 daily   Review of Systems       The patient complains of arthritis/joint pain, back pain, cough, fatigue, and sore throat.          The pertinent positives and negatives are noted as above and in the HPI. All other ROS were reviewed and were negative.   Vital Signs:  Patient profile:   55 year old male Height:      76 inches Weight:      178.13 pounds BMI:     21.76 Pulse rate:   68 / minute Pulse rhythm:   regular BP sitting:   100 / 80  (left arm) Cuff size:   regular  Vitals Entered By: June McMurray CMA Duncan Dull) (April 11, 2010 11:19 AM)  Physical Exam  General:  Well developed, well nourished, no acute distress. Head:  Normocephalic and atraumatic. Mouth:  No deformity or lesions, dentition normal. Lungs:  Clear throughout to auscultation. Heart:  Regular rate and rhythm; no murmurs, rubs,  or bruits. Abdomen:  Soft, nontender and nondistended. No masses, hepatosplenomegaly or hernias noted. Normal bowel sounds. Psych:  Alert and cooperative. Normal mood and affect.  Impression & Recommendations:  Problem # 1:  DYSPHAGIA OROPHARYNGEAL PHASE (ICD-787.22) Pharyngeal dysphagia, likely related to prior cancer treatment. If his aspiration problems worsen or he has significant nutritional deficiency related to inability to take adequate oral nutirition we could consider PEG placement. At this time he appears to be managing fairly well and he is at risk for further aspiration episodes with or without or PEG. Ongoing followup with his primary care physician. I will see back on referral.  Problem # 2:  COLONIC POLYPS, HX OF (ICD-V12.72) Surveillance colonoscopy recommended March 2016.  Problem # 3:  ANEMIA-IRON DEFICIENCY (ICD-280.9) Last hemoglobin 12.1. Further monitoring and management per primary care physician.  Patient Instructions: 1)  Please continue current medications.  2)  Please schedule a follow-up appointment as needed.  3)  Copy sent to : Gordy Savers, MD 4)  The medication list was reviewed and reconciled.  All changed / newly prescribed medications were explained.  A complete  medication list was provided to the patient / caregiver.

## 2010-05-29 NOTE — Letter (Signed)
Summary: Death Certificate/[facility] [employer]  Death Certificate/[facility] [employer]   Imported By: Maryln Gottron 05/20/2010 09:39:44  _____________________________________________________________________  External Attachment:    Type:   Image     Comment:   External Document

## 2010-06-25 ENCOUNTER — Telehealth (INDEPENDENT_AMBULATORY_CARE_PROVIDER_SITE_OTHER): Payer: Self-pay | Admitting: *Deleted

## 2010-07-02 LAB — GLUCOSE, CAPILLARY: Glucose-Capillary: 87 mg/dL (ref 70–99)

## 2010-07-02 NOTE — Progress Notes (Signed)
  Phone Note Other Incoming   Request: Send information Summary of Call: Request for records received from [facility]. Request forwarded to [facility].  01/13/2009

## 2010-07-07 LAB — GLUCOSE, CAPILLARY: Glucose-Capillary: 137 mg/dL — ABNORMAL HIGH (ref 70–99)

## 2010-09-03 NOTE — Assessment & Plan Note (Signed)
OFFICE VISIT   Juan Webb, Juan Webb  DOB:  [birth year]                                        February 28, 2010  CHART #:  [id number]   The patient is referred by Dr. Clelia Croft because of weight loss and  pulmonary nodules.   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  The patient has been followed by Dr. Clelia Croft and in  February 2011, began noting increasing fatigue, cough, chills, was  diagnosed with walking pneumonia and has had several courses of  antibiotics and a 43-pound weight loss.  He notes that he has had poor  appetite, anemia, and was treated with a course of Diflucan recently for  esophageal candidiasis.  He denies any history of tuberculosis.  He  notes that he has never had HIV testing carried out, but feels that he  may be at high risk.   The patient has had a PET scan in July that showed no evidence of  recurrent head and neck cancer.  The CT portion of the scan did show  several ill-defined pulmonary nodules in both lungs which were  nonhypermetabolic.  A followup CT scan in late October 2011, showed a  1.2-cm mass in the right lower lobe.  A PET scan was performed today and  compared to the previous CAT scans and PET scan, the area in question in  the right lower lobe appears to have markedly decreased in size and  again is nonhypermetabolic.  In addition, the areas of question in July  CT scan, seem improved.   PAST MEDICAL HISTORY:  In addition to the above, the patient denies  previous myocardial infarction.  Denies any cardiac history.  Does have  a history of hypertension and history of hyperlipidemia.  Denies  diabetes.  He is a remote smoker, quit 10 years ago.  He has had no  previous stroke.  Denies renal insufficiency.   PAST SURGICAL HISTORY:   Previous surgery includes resection of melanoma  from his nose and pilonidal cyst, left knee arthroscopy.   CURRENT MEDICATIONS:  Diazepam, aspirin, acyclovir p.r.n., Viagra,  levothyroxine, Bystolic, diclofenac, guaifenesin, Ultram, iron,  omeprazole, and Megace.   ALLERGIES:  He has no known allergies.   REVIEW OF SYSTEMS:  He denies headaches, double vision.  No amaurosis or  TIAs.  Denies fever.  Does have chills.  Does have a cough at times with  some mucus, but no blood.  Has had trouble swallowing as noted above.  No difficulty with urination.   PHYSICAL EXAMINATION:  The patient is awake, alert, thin appearing white  male.  Blood pressure 123/78, pulse is 81, respiratory rate is 18, and  O2 sats 89% on room air.  His weight is 170 pounds.  He is afebrile.  He  has obvious surgical resection area of his left neck.  I do not  appreciate any cervical or supraclavicular lymph nodes.  His lungs are  without wheezing or rhonchi.  Cardiac exam reveals a regular rate and  rhythm.  Abdominal exam reveals a thin abdomen.  There is no palpable  organomegaly.  The spleen is not palpably enlarged.  He has no calf  tenderness or pedal edema.   DIAGNOSTIC TESTS:  The patient's CT scans and PET scans were reviewed.  The official report of the PET scan today is not back, but in review of  this, as noted above, he has had waxing and waning pulmonary nodules  that are nonhypermetabolic.  The one noted on the CT scan in end of  October appears to be smaller on the PET scan today and is  nonhypermetabolic which would be surprising if this was a typical  carcinoma of the lung.   IMPRESSION:  1. Male with history of weight loss, esophageal candidiasis, waxing      and waning ill-defined pulmonary nodules/infiltrates.  2. 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  3. History of melanoma resected from the nose.  4. No  history of human immunodeficiency virus testing.  The patient      thinks that he may be at high risk.   PLAN:  After reviewing the various films, it is less obvious that the  patient has a typical carcinoma of the lung.  I have discussed this with  him.  The official report of the PET scan done today is not complete,  but on my review, the area in the lung does not appear hypermetabolic.  I am more concerned that the patient may have other disease processes,  it is explained in his symptoms weight loss, and before proceeding with  any operative procedure, I have referred him to Pulmonology for review  and further consideration for further evaluation, possibly including  HIV testing.  At a minimum, I will see him back in 3 months with a  followup CT scan.  We will also obtain pulmonary function studies as a  baseline.   Sheliah Plane, MD  Electronically Signed   EG/MEDQ  D:  02/28/2010  T:  03/01/2010  Job:  [id number]   cc:   Blenda Nicely. Sonia Side, MD  Charlaine Dalton. Sherene Sires, MD, FCCP

## 2010-09-06 NOTE — Assessment & Plan Note (Signed)
[facility]                            [facility] OFFICE NOTE   NAME:[name], Webb                          MRN:          [id number]  DATE:08/20/2006                            DOB:          [birth year]    A 55 year old gentleman seen today for a wellness exam.  He has a  history of prior left tonsillar cancer, locally metastatic.  He is  followed closely by oncology.  Recent CT of the neck was negative.  Did  have colonoscopy 2 years ago, does have small polyps that were resected  and diverticulosis.  He has a history of a left leg DVT complicating his  surgery and chemotherapy in 2004.  At the present time he takes no  medications.   FAMILY HISTORY:  Father died within the past year, complications of  cardiac disease at 71.  Mother has diabetes, carotid disease and history  of congestive heart failure.  One brother as well, one sister deceased  in a motor vehicle accident.   EXAMINATION:  Revealed a healthy appearing fit male in no acute  distress.  Blood pressure was 126/80.  Fundi, ear, nose and throat unremarkable.  Has post left radical neck.  No adenopathy, no bruits.  CHEST:  Clear.  CARDIOVASCULAR:  Normal heart sounds, no murmurs.  ABDOMEN:  Benign.  EXTERNAL GENITALIA:  Normal.  RECTAL:  Exam prostate +4 and benign.  Stool heme negative.  EXTREMITIES:  Intact.  Peripheral pulses.  No edema.  Did have some  diminished sensation especially with monofilament testing on the dorsum  of the left foot.   IMPRESSION:  1. Status post head and neck cancer.  2. History of left leg deep venous thrombosis.  3. Diverticulosis.  4. Chronic polyps.   DISPOSITION:  We will recheck in 1 year.  Colonoscopy will be  rescheduled in 2011.     Gordy Savers, MD  Electronically Signed    PFK/MedQ  DD: 08/20/2006  DT: 08/20/2006  Job #: [id number]

## 2010-09-06 NOTE — Op Note (Signed)
NAME:  Juan Webb, Juan Webb                           ACCOUNT NO.:  [id number]   MEDICAL RECORD NO.:  [id number]                   PATIENT TYPE:  INP   LOCATION:  3315                                 FACILITY:  [facility]   PHYSICIAN:  Hermelinda Medicus, M.D.                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   POSTOPERATIVE DIAGNOSES:  Squamous cell carcinoma, left tonsil, metastatic  to left neck.   OPERATION:  1. Revision tonsillectomy to establish margins, left.  2. Left radical neck dissection.   SURGEON:  Hermelinda Medicus, M.D.   ASSISTANT:  Kristine Garbe. Ezzard Standing, M.D.   ANESTHESIA:  General endotracheal, Bedelia Person, M.D.   DESCRIPTION OF PROCEDURE:  The patient in supine position and under general  endotracheal anesthesia.  The tonsils were first approached, in the left  tonsil where it had healed from the excisional biopsy but we removed the  entire tonsillar tissue with the surrounding margin, and this was sent for  frozen section and a permanent section.  After this was removed, all  hemostasis was established with Bovie electrocoagulation and the patient  tolerated the procedure very well.  We then repositioned and then prepped  the patient and had his head turned to the right, and all prepping and  draping was done with Betadine and the usual head drapes.  The area of  excision before was excised with this pathology specimen and a neck  dissection incision was made, taken down through the platysma, and all  hemostasis was established with Bovie electrocoagulation. The superior,  posterior and inferior flaps were developed and then the superior triangle  was first approached anteriorly, submaxillary gland was dissected free.  The  ramus mandibularis was guarded very carefully.  The internal  maxillary  artery and vein was seen and carefully guarded, and then the submaxillary  gland was carefully dissected free.  This was dissected from the lingual  nerve, which was guarded carefully, and the submaxillary duct was also  taken, crossclamped, and tied.  All hemostasis was established in this area  with Bovie electrocoagulation and 2-0 silk ties.  The dissection was then  taken from the anterior-belly digastric back to the posterior-belly  digastric, back along the inferior aspect of the parotid tip was taken, and  we then had to work somewhat superiorly as the lymph nodes were extending  quite superiorly.  We extended back down and developed the posterior flap  also, taking down then the superior aspect of the sternocleidomastoid,  taking down to the trapezius, and then coming back across more deeply,  guiding the eleventh nerve and twelfth nerve very carefully.  We isolated  the hypoglossal twelfth nerve very carefully and then were careful not  to  damage the jugular vein at this time.  We found high cervical nodes.  Excisional biopsy was taken for frozen section and was found to be negative.  A second one was done at a later time, was found to be positive but was near  the primary tumor.  The highest node was found to be negative.  We dissected  back around inferiorly and along the trapezius, and then the posterior  dissection was developed after elevating the  posterior skin flap.  The eleventh nerve, accessory nerve, was then  developed and was then isolated and seen using the greater auricular nerve  as a landmark, and this was dissected free from the overlying tissues and  guarded carefully and saved even though lymph nodes were quite close to this  nerve.  We were able to pull the entire neck contents anteriorly, and then  we could again see the eleventh and twelfth nerves.  We then isolated and  crossclamped the jugular vein and were very careful to observe the carotid   and vagus nerve area.  Next the jugular was crossclamped and isolated and  then was tied and then suture ligated.  We were able to cut this and then  develop the tumorous tissue inferiorly during this dissection.  The deep  cervical fascia was our medial landmark, and we were able to work this down  using the jugular vein and we were able to dissect this off the carotid  artery and the vagus nerve without great difficulty even though the mass was  quite large.  We then did our anterior dissection and took this back, and  the inferior aspect of the sternocleidomastoid was crossclamped and Bovie  electrocoagulated.  The omohyoid we left behind because there was  essentially minimal amount of lymph nodes there.  The posterior triangle was  crossclamped and dissected free and then tied and suture ligated.  Once we  had done this, we again found the jugular vein, separated it from the  carotid and the vagus, identified the carotid and vagus for sure, were  absolute, and then the jugular vein was isolated, tied with 2-0 silk, and  then suture ligated and cut.  This was developed then superiorly and  posteriorly around the posterior angle.  Once this was developed, we were  able to elevate the flap from below, the neck contents from below, and the  cervical nerves were taken that were running into the tumorous tissue, and  this was carried back up along the carotid to a point where we could totally  remove these tumorous tissues.  The eleventh and twelfth nerve were intact.  The carotid was in good condition.  The vagus nerve was also in good  condition.  The omohyoid was saved, the phrenic was saved.  We could see the  area of potential chylous fistula, but there was no problem there.  Once all  hemostasis was established, we then could irrigate and began to close the  incisional site.  A #7 Jackson-Pratt drain was used through a posterior incision.  As we were closing, the patient prematurely was  awakening and  bucked on the tube on several severe occasions, and we noticed some  increased bleeding.  We therefore felt we should not take a chance and that  we would go back and explore while we were right here in the process of  closing.  We took down the closing stitches and looked within the contents  of the  neck dissection, extending our anesthesia time.  With this, we found  essentially no true bleeders per se.  We found some areas where I re-  cauterized the inferior end of the sternocleidomastoid muscle and the  posterior triangle and crossclamped and tied and suture ligated some of  these areas a little more extensively.  Superiorly we looked up in the  submaxillary region very carefully, found no evidence of any vessel that was  free.  Posteriorly we again found the similar situation, and inferiorly  posteriorly we found no area of any bleeding.  With this we did use some  Surgicel in the inferior posterior aspect of the neck dissection and along  the posterior neck area.  Once this was completed, we then re-established  our closure using 2-0 chromic catgut, placing the Jackson-Pratt drain to  suction.  We closed the incision with the 2-0 chromic catgut and then the  skin clips were used to close the skin incision.  Once this was completed,  the Jackson-Pratt was sutured in place using 2-0 silk and then taped in  place for security.  Neosporin ointment was placed over the skin clip sites  and then the patient was taken to the recovery room in good condition, ramus  mandibularis in good condition, and the patient doing very well.  The blood  loss here was essentially estimated at 100-150 mL during the procedure and  during that time of increased bleeding in the postanesthesia time.  Otherwise we saw no further blood loss.  The patient tolerated the procedure  very well and is  doing well postoperatively.  He will be in intensive care tonight being  observed carefully.  Then he  will be on the surgical ward.  His follow-up  will be in the hospital and then in one week, two weeks, three weeks, six  weeks, three months, six months, and a year.                                                Hermelinda Medicus, M.D.    JC/MEDQ  D:  06/15/2002  T:  06/16/2002  Job:  [id number]   cc:   Elmer Sow. Dorna Bloom, M.D.  [address] - [facility]  [place]  [place]  [address]  Fax: [phone]   [place], [place] Noel Gerold, MD, [facility]

## 2010-09-06 NOTE — H&P (Signed)
NAME:  Juan Webb, Juan Webb                           ACCOUNT NO.:  [id number]   MEDICAL RECORD NO.:  [id number]                   PATIENT TYPE:  OIB   LOCATION:                                       FACILITY:   PHYSICIAN:  Hermelinda Medicus, M.D.                DATE OF BIRTH:  08/14/1955   DATE OF ADMISSION:  DATE OF DISCHARGE:                                HISTORY & PHYSICAL   HISTORY OF PRESENT ILLNESS:  This patient is a 55 year old male, who has  been a smoker in the past but stopped.  He smoked two packs a day for 29  years, quit in 1998.  In November, he was seen by Dr. Dava Najjar and felt it was  an inflammatory neck swelling that he had.  He had been on two rounds of  antibiotics, and the patient states that he did improve some, but he became  more concerned when it returned.  He entered my office on January 29, and I  placed him immediately on antibiotics but was very concerned and obtained a  CAT scan which showed several large lymph nodes, two of which were 2.6 x 2.1  in diameter, 2.6 x 1.7 diameter, but with no evidence of any primary tumor  in the tonsil, nasopharynx, oropharynx, sinus, larynx, or posterior  pharyngeal wall.  After this CAT scan was completed and no other findings  but the lymph node were established, we decided to do a neck excisional  biopsy on the left side and a direct laryngoscopy and possible biopsy of any  suspicious area of his larynx or tonsillar region or nasopharynx.  He now  enters for this procedure.  He has also had a couple of episodes of  associated sialadenitis in the left maxillary region.  He was thinking, as  they had told him at the [place], this may be a branchial cleft cyst, but the CAT  scan ruled out this diagnosis.  He now enters for surgery with a diagnosis  of possible lymphoma, possible squamous cell carcinoma, metastatic from an  area tonsil or pharyngeal region that was very small.   PAST MEDICAL HISTORY:  Unremarkable.  He smoked for 29  years, two packs.  His only surgery he has ever had was a small cyst on his buttock in the  70's.  Never had any cardiac, pulmonary, neurologic, hematology, endocrine,  or GI problems.  No GU problems.  He has a slight amount of arthritis in his  left hip and knee.   MEDICATIONS:  1. Zocor 5 mg h.s.  2. He has a history of arthritis, takes multiple vitamins occasionally.   PHYSICAL EXAMINATION:  VITAL SIGNS:  Blood pressure 150/90, weight 269,  height 74 inches, heart rate 63.  GENERAL:  He is a well-developed, mildly overweight male, in no acute  distress.  HEENT:  His ears are clear.  Tympanic membranes  are clear.  Nasopharynx is  clear as is his nose.  Oral cavity is clear.  The left and right tonsils  appear to be symmetrical to palpation and appearance.  There is one small  area of firmness in the tonsil that is slightly suspicious.  The  nasopharynx, oropharynx, and larynx are clear.  The true cords, false cords,  epiglottis, base of tongue are clear.  Base of tongue appears to be free of  any abnormality, and the area of suspicion on the tonsil appears reasonably  soft and unlikely to be a tumor.  CHEST:  Clear.  No rales, rhonchi, or wheezes.  CARDIOVASCULAR:  Normal, no snaps, murmurs, or gallops.  ABDOMEN:  Free of any organomegaly, tenderness, and mass.  His abdomen is  unremarkable.  No liver, spleen, or kidneys palpable.  EXTREMITIES:  Unremarkable.   LABORATORY DATA:  His EKG was normal sinus rhythm.   INITIAL DIAGNOSIS:  Left neck mass, rule out lymphoma, possible squamous  cell carcinoma.   PLAN:  Do a direct laryngoscopy and biopsy, especially looking carefully at  that left tonsil and doing an open excision of the left neck.   FURTHER IMPRESSION:  1. Smoking for 29 years.  2. No other findings except arthritis in the left hip.                                               Hermelinda Medicus, M.D.    JC/MEDQ  D:  06/01/2002  T:  06/01/2002  Job:  [id number]    cc:   Noel Gerold, M.D.  [facility]  [place], [place]

## 2010-09-06 NOTE — Discharge Summary (Signed)
NAME:  Juan Webb, Juan Webb                           ACCOUNT NO.:  [id number]   MEDICAL RECORD NO.:  [id number]                   PATIENT TYPE:  INP   LOCATION:  5710                                 FACILITY:  [facility]   PHYSICIAN:  Hermelinda Medicus, M.D.                DATE OF BIRTH:  1956/03/05   DATE OF ADMISSION:  06/15/2002  DATE OF DISCHARGE:  06/20/2002                                 DISCHARGE SUMMARY   HISTORY OF PRESENT ILLNESS:  This patient is a 55 year old male who has been  a smoker for the past 29 years; he stopped in 1998.  He has had a neck  swelling, had been on several rounds of antibiotics.  He had a 2.6 x 1.1-  diameter and a 2.6 x 1.7-diameter neck mass.  He was diagnosed as having a  tonsillar squamous cell carcinoma on the left and a neck biopsy was also  done, showing also a squamous cell carcinoma, and he entered the hospital  for a radical resection of the tonsil, along with a left neck dissection.  I  have discussed this with Dr. Elmer Sow. Wu, radiation therapy/oncology, who  agreed with this concept, as the lesion in the neck was so large.  The  lesion in the tonsil was extremely small but after biopsy, was found to be  quite large in the submucosal region.  He now enters for this procedure.   PAST MEDICAL HISTORY:  His past medical history is that he smoked for 29  years, 2 packs a day.  He has a history of arthritis.   PAST SURGICAL HISTORY:  His only other surgery was a small cyst on his  buttock in the 1970s.   REVIEW OF SYSTEMS:  He never had any cardiac, pulmonary, neurologic,  hematology, endocrine or GI problems.  No GU problems.  He has a slight  amount of arthritis.   MEDICATIONS:  Zocor 5 mg h.s.   PHYSICAL EXAMINATION:  VITAL SIGNS:  His physical examination revealed a  blood pressure of 150/90, weight 269, height 74 inches, heart rate 73.  GENERAL:  He is a well-developed, mildly overweight male in no acute  distress.  ENT:  ENT revealed his  ears to be clear.  The only finding here, nasopharynx  was clear, oral cavity was clear,except for left tonsil showed some area of  biopsy which was done previously where the margins could not be established.  NECK:  His neck shows a fullness from a small also nodule biopsy but showed  several further nodes.  His right neck is completely in good status.  His  CAT scan shows his left neck to show cervical adenopathy, above mentioned,  but no evidence of any adenopathy on the right side.  CHEST:  Chest is clear.  No rales, rhonchi or wheezes.  CARDIOVASCULAR:  No opening snaps, murmurs or gallops.  ABDOMEN:  Abdomen  is mildly obese but liver, spleen and kidneys are  unremarkable.  EXTREMITIES:  Unremarkable.   INITIAL DIAGNOSIS:  Left neck mass, rule out lymphoma, but his squamous cell  carcinoma is the diagnosis.   LABORATORY DATA:  The patient's laboratory was essentially unremarkable.   His EKG showed a normal sinus rhythm.   His white count was slightly elevated at 11.4. Cultures done and Gram's  smear were no organisms seen.   HOSPITAL COURSE:  The patient underwent a revision radical tonsillectomy,  left, with a left radical neck dissection, did very well.  His pathology  report showed multiple lymph nodes positive for tumor and showed still some  malignant tissue in the tonsillar region.  His postop course was uneventful.  His Jackson-Pratt drain was removed three days postoperatively.  The  swelling around his neck has come down very nicely.  His tonsillar region is  healing very nicely and he was discharged on the 5th postoperative day.   FINAL DIAGNOSIS:  Squamous cell carcinoma, left tonsil, with metastatic to  left neck.  He is now recuperating, seen in the office.   DIET:  Soft diet.   DISCHARGE MEDICATIONS:  1. Ceftin 250 mg b.i.d.  2. Tylenol with codeine twice a day.   PLAN:  Our next plan is, once his neck is more stably healed, he will  undergo a full course  of radiation therapy.                                               Hermelinda Medicus, M.D.    JC/MEDQ  D:  07/06/2002  T:  07/07/2002  Job:  [id number]   cc:   [facility], [place], [place] Noel Gerold M.D.   (include pathology report) [place]. M.D.

## 2010-09-06 NOTE — H&P (Signed)
NAME:  Juan Webb, Juan Webb                           ACCOUNT NO.:  [id number]   MEDICAL RECORD NO.:  [id number]                   PATIENT TYPE:  INP   LOCATION:  2550                                 FACILITY:  [facility]   PHYSICIAN:  Hermelinda Medicus, M.D.                DATE OF BIRTH:  Oct 19, 1955   DATE OF ADMISSION:  06/15/2002  DATE OF DISCHARGE:                                HISTORY & PHYSICAL   HISTORY OF PRESENT ILLNESS:  This patient is a 55 year old male who has been  a smoker in the past of two packs a day, but apparently quit in 1998.  He  was a smoker for 29 years.  In November, he was seen by Noel Gerold, M.D.,  who felt that he had inflammatory neck swelling.  He was seen on two round  of antibiotics, but the patient continued to have problems.  He was  concerned and entered my office on May 19, 2002.  He was placed again on  antibiotics and a CT scan was obtained showing 2.6 x 2.1 and a 2.6 x 1.7  diameter lymph nodes on the left side.  He also had concern about his left  tonsil, but there was no real evidence of any tumor on that tonsil.  The  nasopharynx, oropharynx, and sinus were completely clear.  However, he was  brought into the operating room with the idea that he most likely had a  malignancy.  He had a tonsillar biopsy and a neck biopsy and both showed  squamous cell carcinoma.  He now enters for a revision or increased size  tonsillar resection as we could not get the clearance on his margins for his  tonsil and then a left radial neck dissection and to be followed up by  Elmer Sow. Dorna Bloom, M.D., for his radiation therapy.   PAST MEDICAL HISTORY:  Essentially unremarkable.  He has smoked two packs  for 29 years.   PAST SURGICAL HISTORY:  His only other surgery was a small cyst on his  buttock in the 1970s.   REVIEW OF SYSTEMS:  He has never had any cardiac, pulmonary, neurologic,  endocrine, GI problems, or GU problems.  He has a slight amount of arthritis  in his  left hip and knee.   MEDICATIONS:  He takes Zocor 5 mg at h.s.   ALLERGIES:  He has no allergies to medications.   PHYSICAL EXAMINATION:  GENERAL APPEARANCE:  A well-developed, well-  nourished, white male in no acute distress.  VITAL SIGNS:  His blood pressure is 150/80 and pulse 63.  HEENT:  His ears are clear.  His oral cavity is completely clear, except he  has had tonsillar excision in the superior pole.  NECK:  A large left neck mass which was diagnosed in his tonsillar region  and the neck shows squamous cell carcinoma.  His right neck is completely  clear.  A CT scan done showing the left neck nodes, but the right neck was  completely clear.  His neck shows a left mass that is considerable in size  with considerable swelling and a well-healed small scar from his incisional  biopsy site.  CHEST:  His chest x-ray was clear.  His chest is clear AP.  No rales,  rhonchi, or wheezes.  CARDIOVASCULAR:  No murmurs, rubs, or gallops.  ABDOMEN:  Unremarkable.  EXTREMITIES:  Unremarkable.  NEUROLOGIC:  Oriented x 3.  Cranial nerves intact.   He now enters for a left radical neck dissection and a revision left  tonsillectomy in an effort to establish margins or establish that all of the  lesion was removed.                                               Hermelinda Medicus, M.D.    JC/MEDQ  D:  06/15/2002  T:  06/15/2002  Job:  [id number]   cc:   Noel Gerold, M.D.  [facility]  [place], [place]   Jill Alexanders J. Dorna Bloom, M.D.  [address] - [facility]  [place]  [place]  [address]  Fax: [phone]

## 2010-09-06 NOTE — Op Note (Signed)
NAME:  TEL, HEVIA NO.:  [id number]   MEDICAL RECORD NO.:  [id number]                    PATIENT TYPE:  OIB   LOCATION:                                       FACILITY:   PHYSICIAN:  Hermelinda Medicus, M.D.                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  Rule out  squamous cell carcinoma.  Unknown primary.   POSTOPERATIVE DIAGNOSIS:  Squamous cell carcinoma with biopsy frozen section  diagnosis squamous cell carcinoma of left tonsil superior and left neck  node.   SURGEON:  Hermelinda Medicus, M.D.   ASSISTANT:  Kristine Garbe. Ezzard Standing, M.D.   PROCEDURE:  Excisional biopsy on a left neck node with panendoscopy and  biopsy of left superior tonsillar tissue.   ANESTHESIA:  General endotracheal.   PROCEDURE IN DETAIL:  The patient was placed in the supine position under  general endotracheal anesthesia.  The area of the larynx was carefully  evaluated using the laryngoscope.  The base of tongue, tonsillar tissue were  all carefully evaluated and palpated and found to be soft and pliable and  there was no suspicious area of tonsillar or lateral pharyngeal wall or base  of tongue lesion.  The piriforms were clear, the larynx was clear.  The  lateral pharyngeal wall, epiglottis were all clear.  With this we prepped  and draped.  After prepping with Betadine and the usual draping and the  usual gloving and regowning, we then made an incision over the mid portion  of the neck and carried down through the platysma and through the anterior  border of the sternocleidomastoid.  We then found the largest lymph node and  removed this using the Bovie electrocoagulation for hemostasis.  This node  will be sent for frozen section as it was quite firm, increasing our  suspicion of squamous cell carcinoma.  Hemostasis was  established.  A  Jackson-Pratt drain was placed and then the area was closed using 2-0  chromic catgut and 4-0 Ethilon.  The drain was sutured in place also using  the 4-0 Ethilon and Steri-Strips were applied.  The biopsied frozen section  was returned squamous cell carcinoma.  Therefore we felt we needed to go  back and re-evaluate the tonsillar area for possible blind biopsy which we  did.  There was one area on the superior tonsil that may have been slightly  firm and we biopsied this and on frozen section this was returned squamous  cell carcinoma.  With this all  hemostasis was established in the superior tonsillar excision and the  stomach was suctioned.  The tonsillar bed was checked once again for  hemostasis and the patient was awakened, tolerated the procedure very well  and is doing well postoperatively.                                               Hermelinda Medicus, M.D.    JC/MEDQ  D:  06/01/2002  T:  06/01/2002  Job:  [id number]   cc:   Kristine Garbe. Ezzard Standing, M.D.  [address]  [place] [address]  Fax: [phone]   Tennis Ship, Dr  [facility]

## 2011-11-05 IMAGING — CR DG CHEST 2V
3 series · 3 of 3 positions shown · non-contrast
Comparison: February 25, 2008 and CT scan dated November 29, 2008

CLINICAL DATA: Shortness of breath, cough, hypertension, history of
tonsillar cancer

CHEST - 2 VIEW

[view not recorded (1 of 3)]
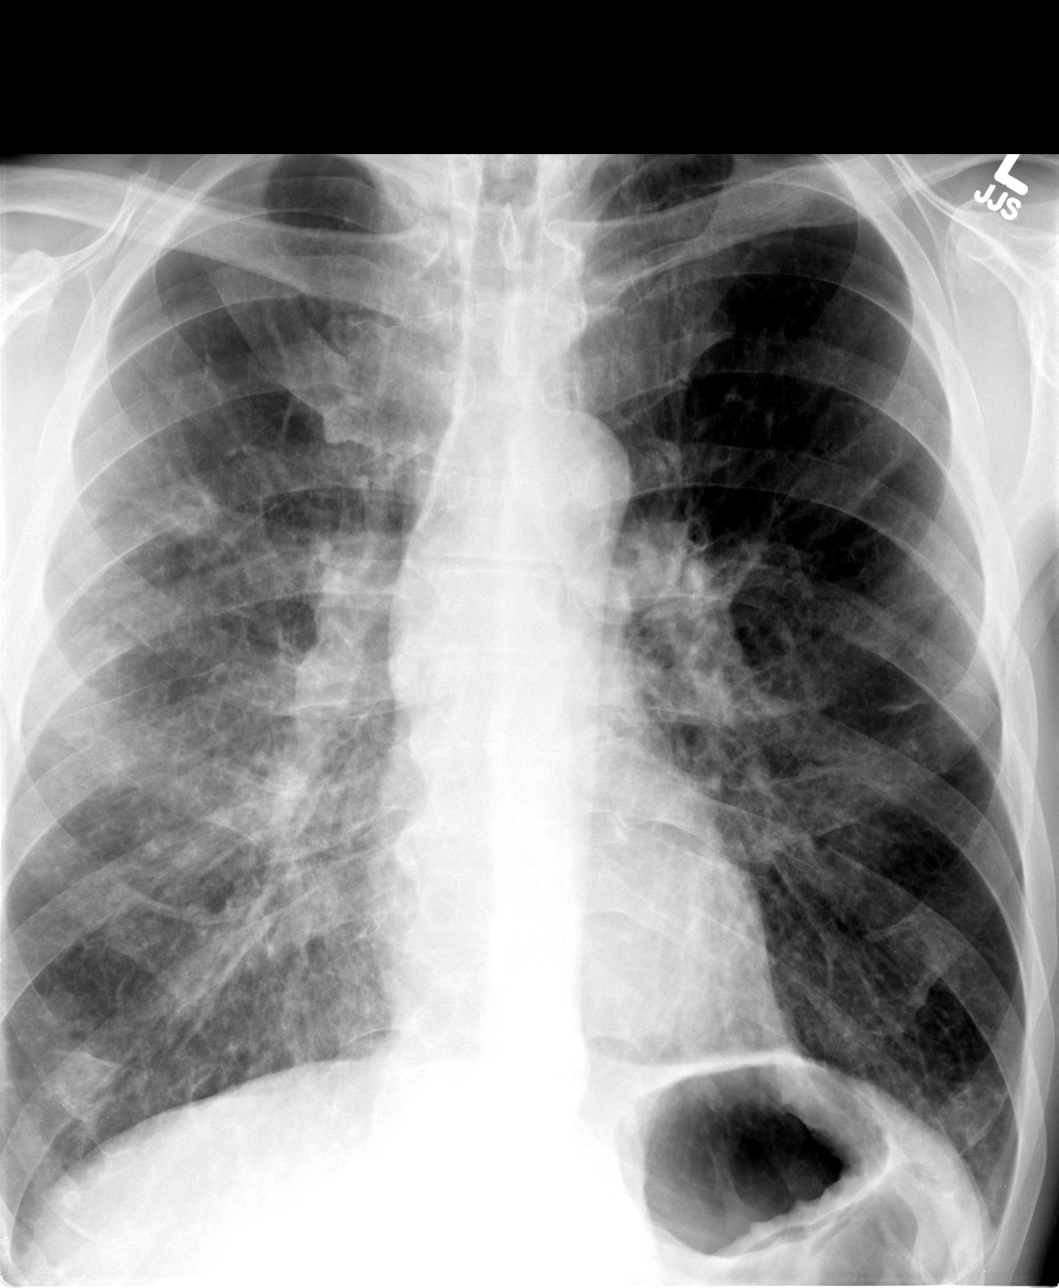

[view not recorded (2 of 3)]
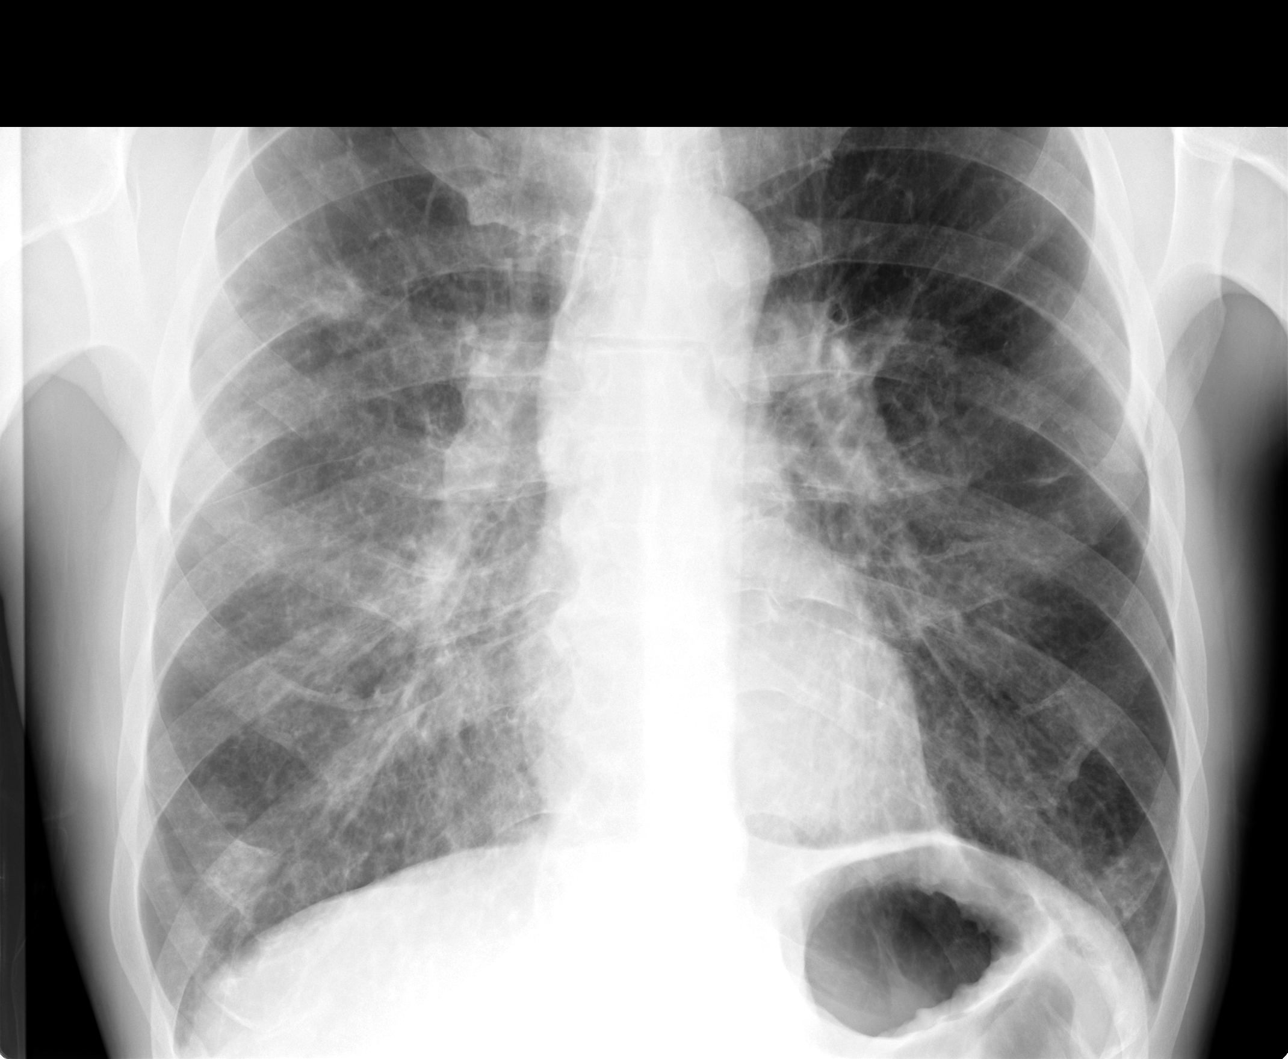

[view not recorded (3 of 3)]
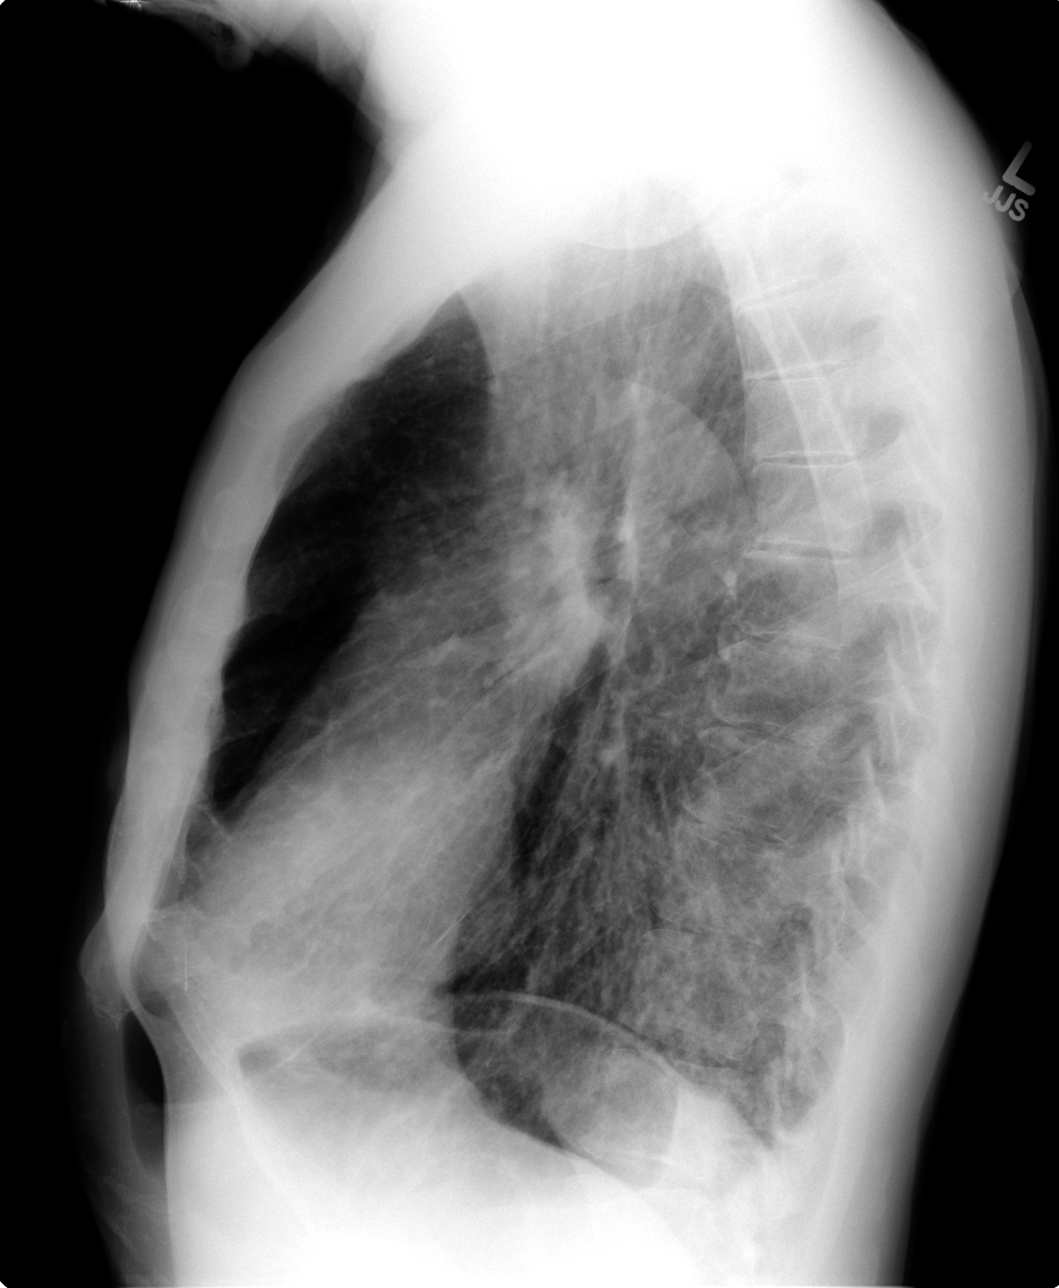

[3 of 3 positions shown; findings below may reference images not displayed]

FINDINGS: Both lungs are hyperexpanded, and there is diffuse
interstitial thickening bilaterally, most prominent at the bases.
The cardiac silhouette, mediastinum, and pulmonary vasculature are
within normal limits.  There is a new focal opacity in the right
upper lung.  No evidence of pleural effusion bilaterally.
IMPRESSION: There is new focal opacity in the right upper lung.  Given the
history of tonsillar cancer in the past, CT scan of the chest is
recommended to exclude metastatic disease.

Diffuse interstitial thickening which is most prominent at the
bases, has progressed since the prior chest x-ray.

Report called.

## 2011-11-08 IMAGING — CT CT CHEST W/ CM
2 of 3 series · 15 of 36 positions shown, 18 images · IV contrast (Omnipaque 300)
Comparison: 02/15/2010 chest radiograph, CT 11/29/2008

CLINICAL DATA: Right upper lung nodule on prior chest radiograph.
History of tonsillar cancer.  Shortness of breath with exertion.
43 pound weight loss.

CT CHEST WITH CONTRAST
TECHNIQUE: Multidetector CT imaging of the chest was performed
following the standard protocol during bolus administration of
intravenous contrast.
Contrast: 80 ml Omniscan 300 IV contrast

[Series 2: chest routine with · axial · 0.72mm/px · z∈[+1194,+1494]mm · 12 of 72 slices shown, 15 images]
[im 6/72  mediastinal]
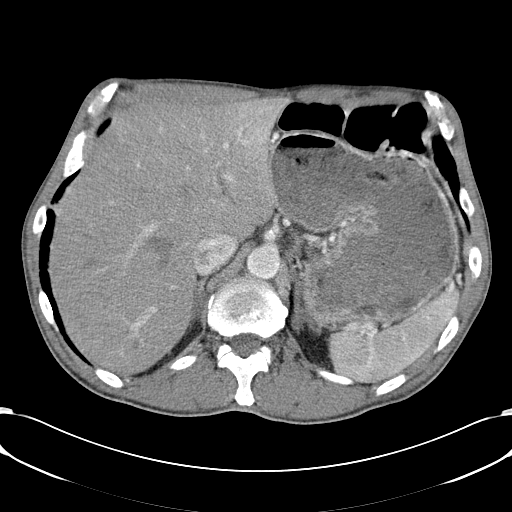
[im 6/72  lung]
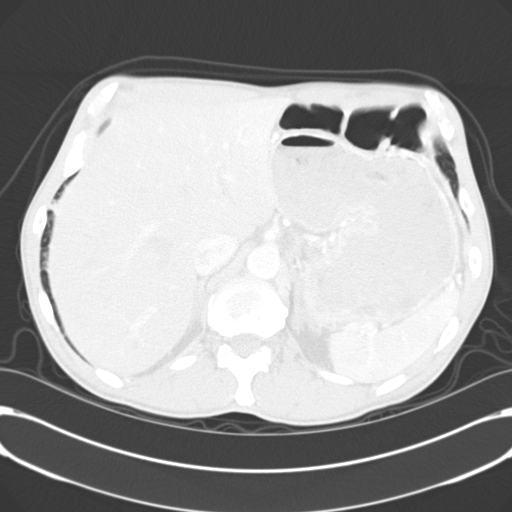
[im 11/72  lung]
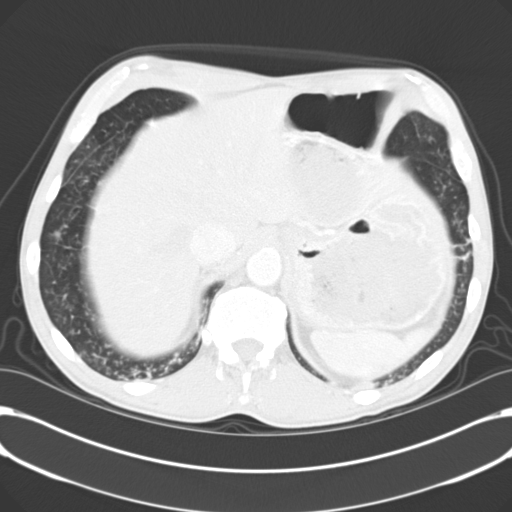
[im 16/72  lung]
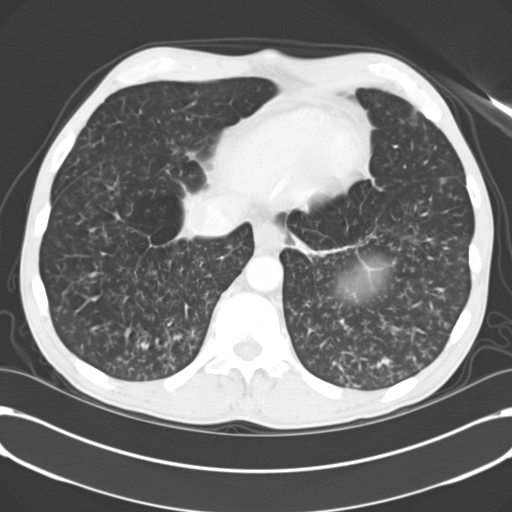
[im 22/72  lung]
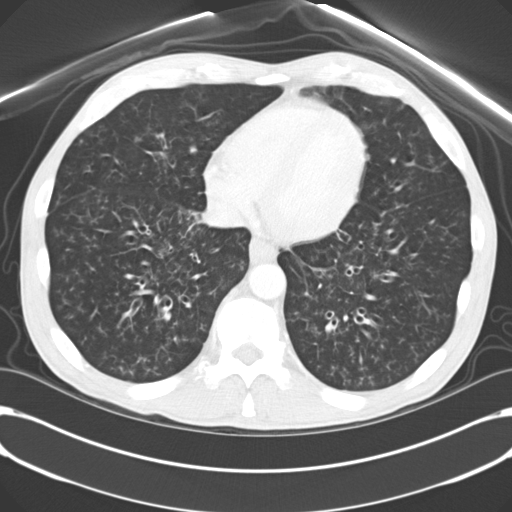
[im 27/72  mediastinal]
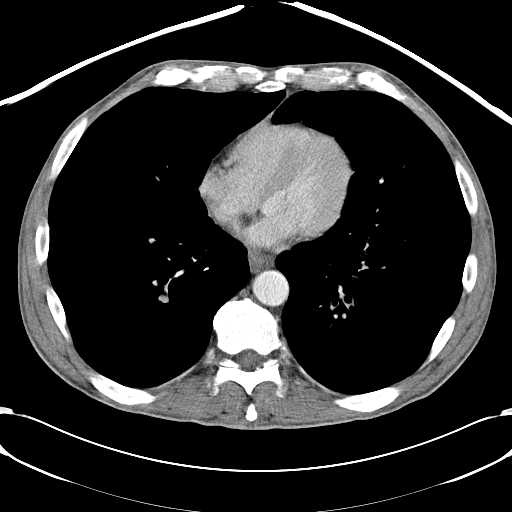
[im 27/72  lung]
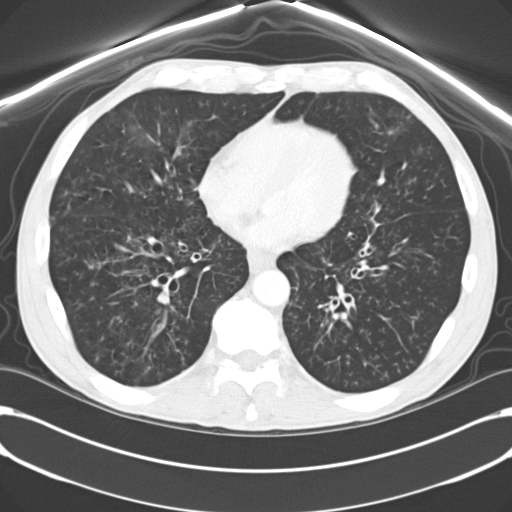
[im 32/72  lung]
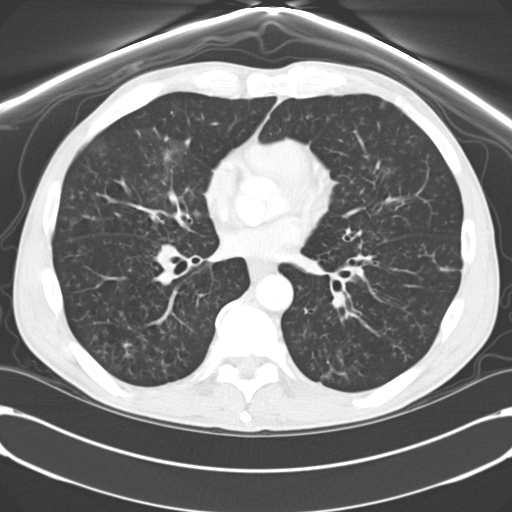
[im 40/72  lung]
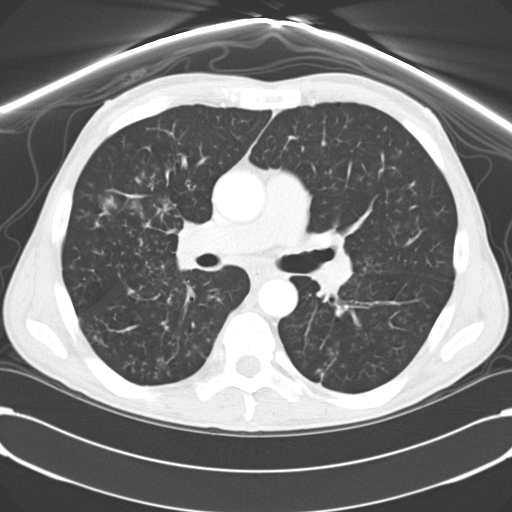
[im 45/72  lung]
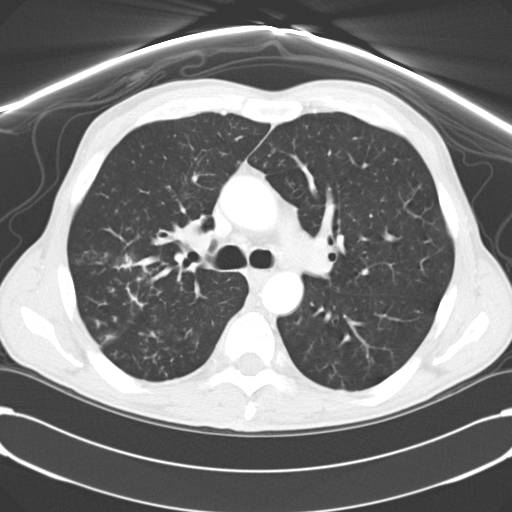
[im 50/72  mediastinal]
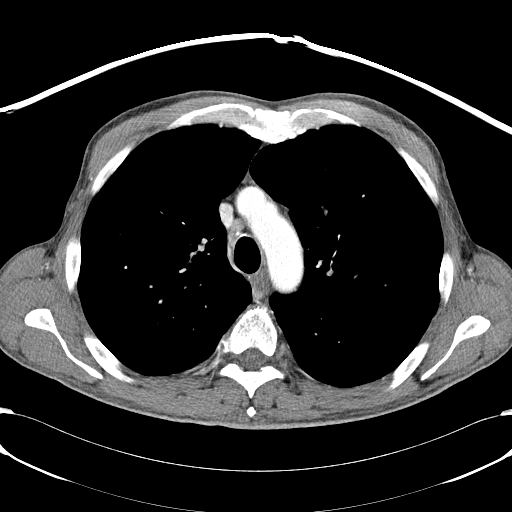
[im 50/72  lung]
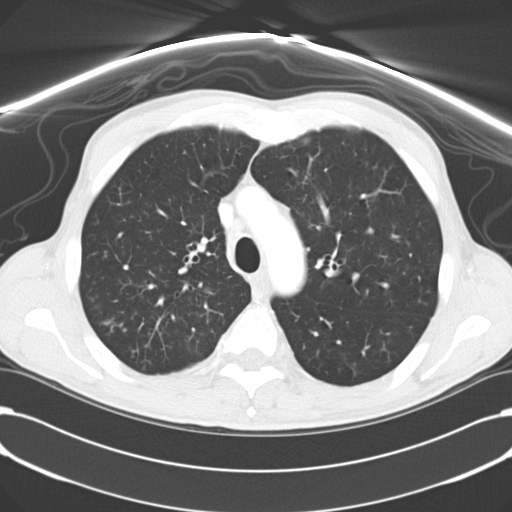
[im 56/72  lung]
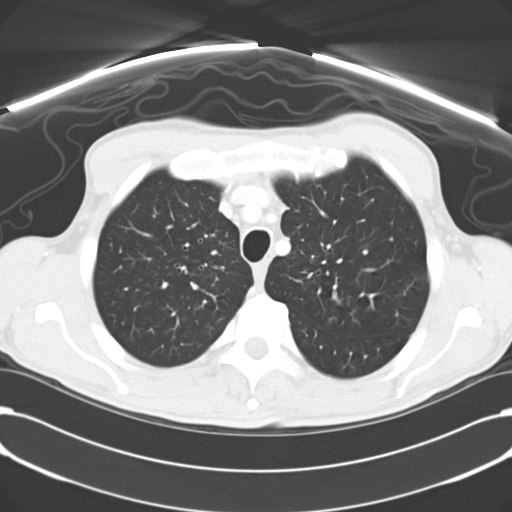
[im 61/72  lung]
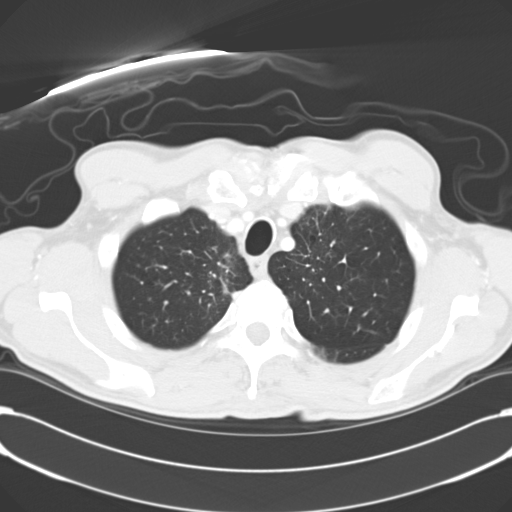
[im 66/72  lung]
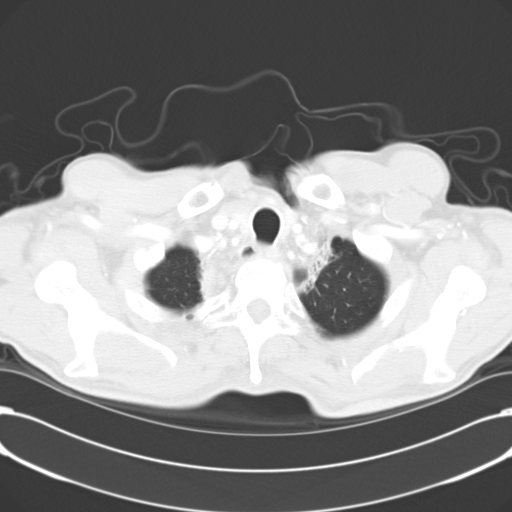

[Series 602: <mpr thick range> · coronal · 0.72mm/px · 3 of 125 slices shown]
[im 25/125  lung]
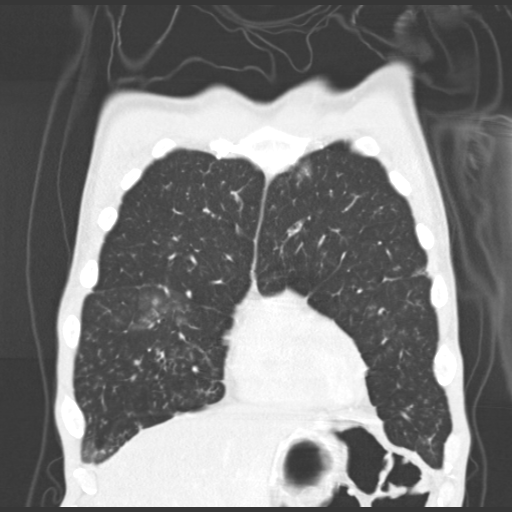
[im 50/125  lung]
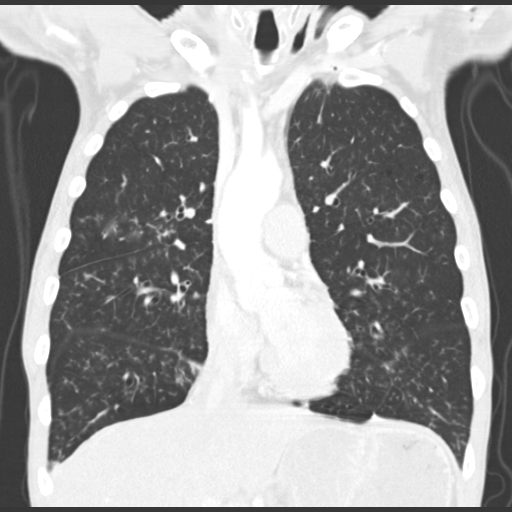
[im 75/125  lung]
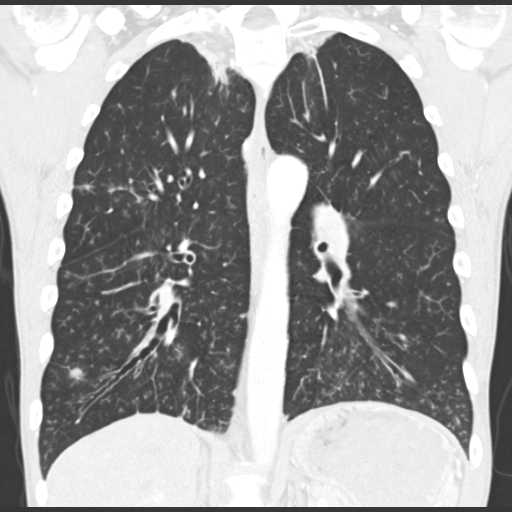

[15 of 36 positions shown; findings below may reference images not displayed]

FINDINGS: Linear biapical medial pleural thickening/pulmonary
parenchymal scarring likely indicates prior radiation port.
Scattered areas of tree in bud pulmonary parenchymal nodular
opacity are noted.  Areas of central bronchiectasis are present.
There is a mildly irregular 1.3 x 0.9 cm nodule the right lower
lobe on image 53 corresponding to the chest imaging findings on
prior exam.  Linear bilateral lower lobe atelectasis or scarring.
Central airways are patent.  No pleural effusion.  No acute osseous
abnormality. Heart size is normal.  Fluid density structure
partially visualized superior to the right kidney could represent a
renal cyst but is incompletely evaluated.  No lymphadenopathy.
Evidence of left lateral cervical dissection partly visualized.
IMPRESSION: Spiculated right lower lobe pulmonary nodule, highly suspicious for
bronchogenic carcinoma, less likely metastasis, given its
development since the prior study.

Diffuse tree in bud nodular pulmonary parenchymal opacities, which
in general indicate small airways infectious etiology. The
previously questioned area of nodularity in the right upper lobe
corresponds to a confluent area of tree in bud nodular opacity.

Further evaluation with PET CT is recommended for the purposes of
staging prior to sampling.  Evidence of neck radiation with
biapical fibrosis related to the given history of tonsillar cancer.

## 2015-11-12 NOTE — Progress Notes (Signed)
This encounter was created in error - please disregard.
